# Patient Record
Sex: Male | Born: 1985 | Race: Black or African American | Hispanic: No | Marital: Single | Smoking: Never smoker
Health system: Southern US, Community
[De-identification: ages and names within clinical notes are randomized; demographics above are authoritative.]

## PROBLEM LIST (undated history)

## (undated) DIAGNOSIS — F329 Major depressive disorder, single episode, unspecified: Secondary | ICD-10-CM

## (undated) DIAGNOSIS — I10 Essential (primary) hypertension: Secondary | ICD-10-CM

## (undated) DIAGNOSIS — F419 Anxiety disorder, unspecified: Secondary | ICD-10-CM

## (undated) DIAGNOSIS — F32A Depression, unspecified: Secondary | ICD-10-CM

## (undated) DIAGNOSIS — J45909 Unspecified asthma, uncomplicated: Secondary | ICD-10-CM

## (undated) HISTORY — DX: Anxiety disorder, unspecified: F41.9

## (undated) HISTORY — PX: CARDIAC SURGERY: SHX584

## (undated) HISTORY — DX: Essential (primary) hypertension: I10

## (undated) HISTORY — DX: Major depressive disorder, single episode, unspecified: F32.9

## (undated) HISTORY — DX: Depression, unspecified: F32.A

## (undated) HISTORY — DX: Unspecified asthma, uncomplicated: J45.909

---
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
## 2012-03-25 ENCOUNTER — Emergency Department: Payer: Self-pay | Admitting: Emergency Medicine

## 2013-11-30 ENCOUNTER — Emergency Department: Payer: Self-pay | Admitting: Emergency Medicine

## 2013-11-30 LAB — CBC
HCT: 49.1 % (ref 40.0–52.0)
HGB: 16.8 g/dL (ref 13.0–18.0)
MCH: 32 pg (ref 26.0–34.0)
MCHC: 34.1 g/dL (ref 32.0–36.0)
MCV: 94 fL (ref 80–100)
PLATELETS: 223 10*3/uL (ref 150–440)
RBC: 5.23 10*6/uL (ref 4.40–5.90)
RDW: 12.7 % (ref 11.5–14.5)
WBC: 7.4 10*3/uL (ref 3.8–10.6)

## 2013-11-30 LAB — BASIC METABOLIC PANEL
Anion Gap: 6 — ABNORMAL LOW (ref 7–16)
BUN: 7 mg/dL (ref 7–18)
CO2: 29 mmol/L (ref 21–32)
Calcium, Total: 9.1 mg/dL (ref 8.5–10.1)
Chloride: 102 mmol/L (ref 98–107)
Creatinine: 1.18 mg/dL (ref 0.60–1.30)
EGFR (African American): >60
Glucose: 96 mg/dL (ref 65–99)
OSMOLALITY: 272 (ref 275–301)
Potassium: 3.8 mmol/L (ref 3.5–5.1)
Sodium: 137 mmol/L (ref 136–145)

## 2013-11-30 LAB — DRUG SCREEN, URINE
Amphetamines, Ur Screen: NEGATIVE (ref ?–1000)
BENZODIAZEPINE, UR SCRN: NEGATIVE (ref ?–200)
Barbiturates, Ur Screen: NEGATIVE (ref ?–200)
Cannabinoid 50 Ng, Ur ~~LOC~~: NEGATIVE (ref ?–50)
Cocaine Metabolite,Ur ~~LOC~~: NEGATIVE (ref ?–300)
MDMA (ECSTASY) UR SCREEN: NEGATIVE (ref ?–500)
Methadone, Ur Screen: NEGATIVE (ref ?–300)
OPIATE, UR SCREEN: NEGATIVE (ref ?–300)
Phencyclidine (PCP) Ur S: NEGATIVE (ref ?–25)
Tricyclic, Ur Screen: NEGATIVE (ref ?–1000)

## 2013-11-30 LAB — TROPONIN I: Troponin-I: <0.02 ng/mL

## 2013-11-30 LAB — CK TOTAL AND CKMB (NOT AT ARMC)
CK, TOTAL: 177 U/L
CK-MB: 0.9 ng/mL (ref 0.5–3.6)

## 2013-11-30 LAB — D-DIMER(ARMC)

## 2014-11-25 ENCOUNTER — Encounter: Payer: Self-pay | Admitting: Family Medicine

## 2015-01-02 IMAGING — CT CT ANGIO AORTIC DISSECTION W/ CM
2 of 6 series · 3 of 16 positions shown, 4 images · IV contrast (APPLIED)
Comparison: None.

CLINICAL DATA: Chest pain

EXAM:
CT ANGIOGRAPHY CHEST WITH CONTRAST
TECHNIQUE: Multidetector CT imaging of the chest was performed using the
standard protocol during bolus administration of intravenous
contrast. Multiplanar CT image reconstructions and MIPs were
obtained to evaluate the vascular anatomy.
CONTRAST:  100 cc Isovue 370.

[Series 6: arterial · axial · arterial · 0.61mm/px · z∈[-600,-488]mm · 2 of 169 slices shown, 3 images]
[im 57/169  soft-tissue]
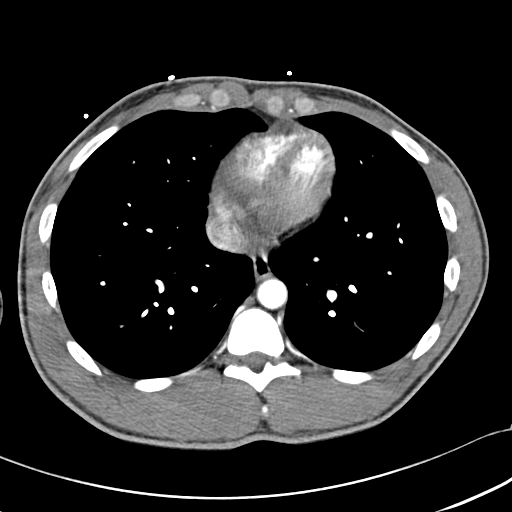
[im 57/169  bone]
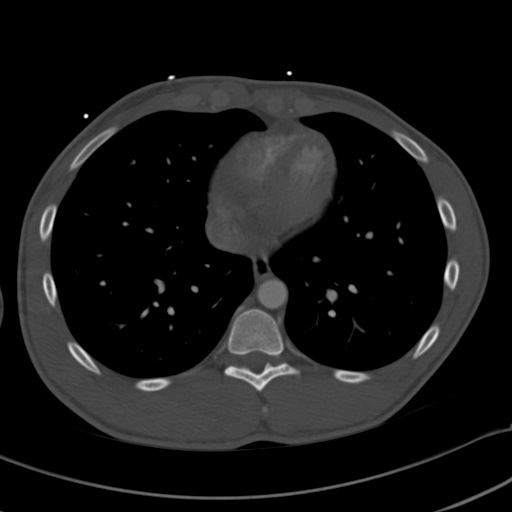
[im 113/169  soft-tissue]
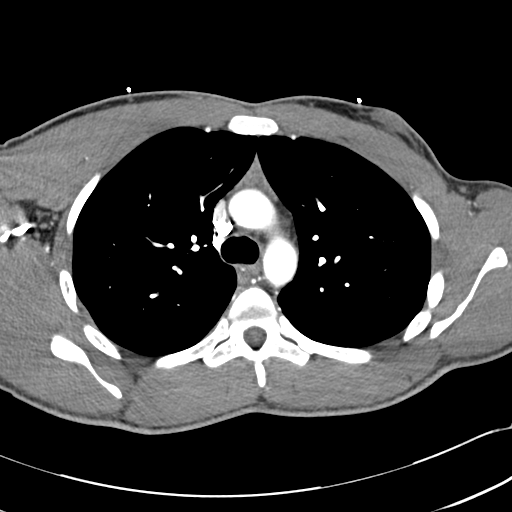

[Series 9: sag arterial mpr · sagittal · arterial · 0.52mm/px · 1 of 148 slices shown]
[im 74/148  soft-tissue]
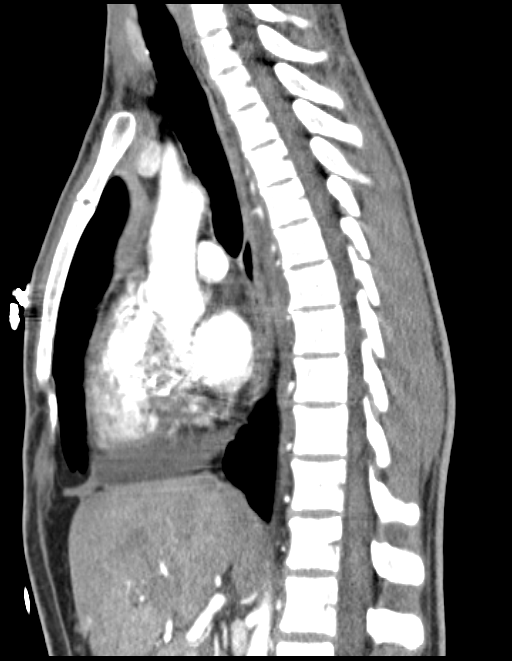

[3 of 16 positions shown; findings below may reference images not displayed]

FINDINGS: Pre contrast imaging shows no dense crescent in the wall of the
thoracic aorta suggest acute intramural hematoma. Imaging after IV
contrast administration shows no dissection of the thoracic aorta.
There is no thoracic aortic aneurysm. Pulmonary arteries are well
opacified and show no evidence for pulmonary embolus.

No evidence for axillary lymphadenopathy. No mediastinal or hilar
lymphadenopathy. Soft tissue attenuation in the anterior mediastinum
has characteristic triangular configuration as seen in cases of
thymic remnant. Heart size is normal. There is a small amount of
fluid inferior to the heart which appears to be pericardial in
location.

Bilateral gynecomastia is evident.

There is some minimal atelectasis in the dependent right lung base.
Lungs are otherwise clear.

Bone windows reveal no worrisome lytic or sclerotic osseous lesions.
Numerous small bilateral nonobstructing renal stones are evident.

Review of the MIP images confirms the above findings.
IMPRESSION: No thoracic aortic dissection.  No evidence for pulmonary embolus.

Small inferior pericardial effusion.

Bilateral gynecomastia.

Bilateral nephrolithiasis.

## 2017-02-25 ENCOUNTER — Encounter: Payer: Self-pay | Admitting: Family Medicine

## 2017-02-25 ENCOUNTER — Ambulatory Visit (INDEPENDENT_AMBULATORY_CARE_PROVIDER_SITE_OTHER): Payer: Self-pay | Admitting: Family Medicine

## 2017-02-25 VITALS — BP 110/68 | HR 93 | Temp 98.8°F | Resp 14 | Ht 67.0 in | Wt 138.4 lb

## 2017-02-25 DIAGNOSIS — Z9889 Other specified postprocedural states: Secondary | ICD-10-CM | POA: Insufficient documentation

## 2017-02-25 DIAGNOSIS — Z23 Encounter for immunization: Secondary | ICD-10-CM

## 2017-02-25 DIAGNOSIS — K029 Dental caries, unspecified: Secondary | ICD-10-CM

## 2017-02-25 MED ORDER — AMOXICILLIN 500 MG PO CAPS: 2000.0000 mg | ORAL_CAPSULE | Freq: Once | ORAL | 5 refills | Status: DC

## 2017-02-25 NOTE — Assessment & Plan Note (Addendum)
Antibiotic prophylaxis prior to dental cleaning, any procedures; refills provided

## 2017-02-25 NOTE — Patient Instructions (Signed)
You received the flu shot today; it should protect you against the flu virus over the coming months; it will take about two weeks for antibodies to develop; do try to stay away from hospitals, nursing homes, and daycares during peak flu season; taking extra vitamin C daily during flu season may help you avoid getting sick Please do take the antibiotic 30-60 minutes prior to your procedure(s)

## 2017-02-25 NOTE — Progress Notes (Signed)
BP 110/68   Pulse 93   Temp 98.8 F (37.1 C) (Oral)   Resp 14   Ht 5\' 7"  (1.702 m)   Wt 138 lb 6.4 oz (62.8 kg)   SpO2 98%   BMI 21.68 kg/m    Subjective:    Patient ID: Fernando Reid, male    DOB: 11-02-1985, 31 y.o.   MRN: [id number]  HPI: Fernando Reid is a 31 y.o. male  Chief Complaint  Patient presents with  . Medication Refill    antibiotic for teeth cleaning due to heart surgery as a child    HPI Patient is here to get antibiotics He has planned dental cleaning; he has a cavity on tooth # 8 near the gumline and goes to have cleaning and then cavity filled soon He had surgery as a child to repair a "hole" in his heart and needs antibiotics prior to dental procedures No fevers Health is overall good he says  Depression screen PHQ 2/9 02/25/2017  Decreased Interest 0  Down, Depressed, Hopeless 0  PHQ - 2 Score 0    Relevant past medical, surgical, family and social history reviewed Past Medical History:  Diagnosis Date  . Anxiety   . Asthma   . Depression   . Hypertension    Past Surgical History:  Procedure Laterality Date  . CARDIAC SURGERY     to fix hole in heart   Family History  Problem Relation Age of Onset  . Diabetes Mother   . Diabetes Father   . COPD Maternal Grandmother   . Bronchitis Maternal Grandmother   . Hypertension Maternal Grandfather   . CAD Maternal Grandfather   . Aneurysm Paternal Grandfather    Social History   Social History  . Marital status: Single    Spouse name: N/A  . Number of children: N/A  . Years of education: N/A   Occupational History  . Not on file.   Social History Main Topics  . Smoking status: Never Smoker  . Smokeless tobacco: Never Used  . Alcohol use No  . Drug use: No  . Sexual activity: Not Currently   Other Topics Concern  . Not on file   Social History Narrative  . No narrative on file    Interim medical history since last visit reviewed. Allergies and medications  reviewed  Review of Systems Per HPI unless specifically indicated above     Objective:    BP 110/68   Pulse 93   Temp 98.8 F (37.1 C) (Oral)   Resp 14   Ht 5\' 7"  (1.702 m)   Wt 138 lb 6.4 oz (62.8 kg)   SpO2 98%   BMI 21.68 kg/m   Wt Readings from Last 3 Encounters:  02/25/17 138 lb 6.4 oz (62.8 kg)    Physical Exam  Constitutional: He appears well-developed and well-nourished. No distress.  HENT:  Mouth/Throat: Dental caries present.  Dental caries noted, tooth #8 near the gum line; no erythema of gum, no drainage, no swelling  Eyes: No scleral icterus.  Cardiovascular: Normal rate and regular rhythm.   Pulmonary/Chest: Effort normal and breath sounds normal.  Neurological: He is alert.  Skin: No pallor.  Psychiatric: He has a normal mood and affect. His mood appears not anxious. He does not exhibit a depressed mood.      Assessment & Plan:   Problem List Items Addressed This Visit      Other   Status post cardiac surgery  Antibiotic prophylaxis prior to dental cleaning, any procedures; refills provided       Other Visit Diagnoses    Dental cavity    -  Primary   patient going to see dentist soon for cleaning/evaluation and then filling; antibiotic prophylaxis prior to BOTH, any procedures; Rx plus refills   Needs flu shot       offered and given today   Relevant Orders   Flu Vaccine QUAD 6+ mos PF IM (Fluarix Quad PF) (Completed)      Follow up plan: Return in about 3 months (around 05/28/2017) for complete physical.  An after-visit summary was printed and given to the patient at [facility].  Please see the patient instructions which may contain other information and recommendations beyond what is mentioned above in the assessment and plan.  Meds ordered this encounter  Medications  . amoxicillin (AMOXIL) 500 MG capsule    Sig: Take 4 capsules (2,000 mg total) by mouth once. 30-60 minutes prior to appointment    Dispense:  4 capsule    Refill:  5     Orders Placed This Encounter  Procedures  . Flu Vaccine QUAD 6+ mos PF IM (Fluarix Quad PF)

## 2017-05-30 ENCOUNTER — Encounter: Payer: Self-pay | Admitting: Family Medicine

## 2017-06-06 ENCOUNTER — Ambulatory Visit: Payer: Self-pay | Admitting: Family Medicine

## 2017-06-06 ENCOUNTER — Encounter: Payer: Self-pay | Admitting: *Deleted

## 2017-06-06 ENCOUNTER — Encounter: Payer: Self-pay | Admitting: Family Medicine

## 2017-06-06 VITALS — BP 184/96 | HR 87 | Temp 98.5°F | Resp 14 | Ht 65.25 in | Wt 137.7 lb

## 2017-06-06 DIAGNOSIS — Z114 Encounter for screening for human immunodeficiency virus [HIV]: Secondary | ICD-10-CM

## 2017-06-06 DIAGNOSIS — Z113 Encounter for screening for infections with a predominantly sexual mode of transmission: Secondary | ICD-10-CM

## 2017-06-06 DIAGNOSIS — Z0001 Encounter for general adult medical examination with abnormal findings: Secondary | ICD-10-CM

## 2017-06-06 DIAGNOSIS — Z23 Encounter for immunization: Secondary | ICD-10-CM

## 2017-06-06 DIAGNOSIS — Z0000 Encounter for general adult medical examination without abnormal findings: Secondary | ICD-10-CM | POA: Insufficient documentation

## 2017-06-06 DIAGNOSIS — D235 Other benign neoplasm of skin of trunk: Secondary | ICD-10-CM

## 2017-06-06 DIAGNOSIS — I10 Essential (primary) hypertension: Secondary | ICD-10-CM

## 2017-06-06 DIAGNOSIS — L608 Other nail disorders: Secondary | ICD-10-CM | POA: Insufficient documentation

## 2017-06-06 MED ORDER — TETANUS-DIPHTH-ACELL PERTUSSIS 5-2.5-18.5 LF-MCG/0.5 IM SUSP: 0.5000 mL | Freq: Once | INTRAMUSCULAR | Status: DC

## 2017-06-06 MED ORDER — HYDROCHLOROTHIAZIDE 25 MG PO TABS: 25.0000 mg | ORAL_TABLET | Freq: Every day | ORAL | 3 refills | Status: DC

## 2017-06-06 NOTE — Progress Notes (Signed)
Patient ID: Fernando Reid, male   DOB: 05-22-1985, 32 y.o.   MRN: [id number]   Subjective:   Fernando Reid is a 32 y.o. male here for a complete physical exam  Interim issues since last visit: a few colds, nothing serious  USPSTF grade A and B recommendations Depression:  Depression screen [place] 2/9 06/06/2017 02/25/2017  Decreased Interest 0 0  Down, Depressed, Hopeless 0 0  PHQ - 2 Score 0 0   Hypertension: shows up high on electric pump; has been on HCTZ in the past; in the 190s on top at the dentist, arm at the dentist and wrist at oral surgeon; that was also high; both electronic BP cuffs, but they checked both arms; did not have high blood pressure as a child; had chest pains about 5 years ago; crushing in his chest; went to the hospital; had high BP then; tried to bring that down; that's when he started the BP medicine; mother may have HTN and both maternal grandparents have HTN; he ran out of refills about 2 years ago; he denies fevers or night sweats; has had splinter hemorrhage for about 2 years  BP Readings from Last 3 Encounters:  06/06/17 (!) 184/96  02/25/17 110/68   Obesity: Wt Readings from Last 3 Encounters:  06/06/17 137 lb 11.2 oz (62.5 kg)  02/25/17 138 lb 6.4 oz (62.8 kg)   BMI Readings from Last 3 Encounters:  06/06/17 22.74 kg/m  02/25/17 21.68 kg/m     Skin cancer: he does have a mole on the back; been there for quite a while; not bleeding, pretty large Lung cancer:  nonsmoker Prostate cancer: at 32, no fam hx  No results found for: PSA Colorectal cancer: no fam hx  AAA: n/a Aspirin: na Diet: in between typical [place] eater except not much meat at all; good protein Exercise: trying to be active Alcohol: no Tobacco use: no HIV, hep B, hep C: check STD testing and prevention (chl/gon/syphilis): check  Lipids: runs in the family, grandfather No results found for: CHOL No results found for: HDL No results found for: LDLCALC No results  found for: TRIG No results found for: CHOLHDL No results found for: LDLDIRECT Glucose: both parents have diabetes; both developed in their 110s Glucose  Date Value Ref Range Status  11/30/2013 96 65 - 99 mg/dL Final    Past Medical History:  Diagnosis Date  . Anxiety   . Asthma   . Depression   . Hypertension    Past Surgical History:  Procedure Laterality Date  . CARDIAC SURGERY     to fix hole in heart   Family History  Problem Relation Age of Onset  . Diabetes Mother   . Diabetes Father   . COPD Maternal Grandmother   . Bronchitis Maternal Grandmother   . Hypertension Maternal Grandfather   . CAD Maternal Grandfather   . Aneurysm Paternal Grandfather    Social History   Tobacco Use  . Smoking status: Never Smoker  . Smokeless tobacco: Never Used  Substance Use Topics  . Alcohol use: No  . Drug use: No   Review of Systems  Objective:   Vitals:   06/06/17 1055 06/06/17 1131  BP: 124/72 (!) 184/96  Pulse: 87   Resp: 14   Temp: 98.5 F (36.9 C)   TempSrc: Oral   SpO2: 98%   Weight: 137 lb 11.2 oz (62.5 kg)   Height: 5' 5.25" (1.657 m)    Body mass index is  22.74 kg/m. Wt Readings from Last 3 Encounters:  06/06/17 137 lb 11.2 oz (62.5 kg)  02/25/17 138 lb 6.4 oz (62.8 kg)   Physical Exam  Constitutional: He appears well-developed and well-nourished. No distress.  HENT:  Head: Normocephalic and atraumatic.  Nose: Nose normal.  Mouth/Throat: Oropharynx is clear and moist.  Cerumen in both ears  Eyes: EOM are normal. No scleral icterus.  Neck: No JVD present. No thyromegaly present.  Cardiovascular: Normal rate, regular rhythm and normal heart sounds.  No extrasystoles are present.  No murmur heard. Pulses:      Radial pulses are 2+ on the right side, and 2+ on the left side.       Dorsalis pedis pulses are 1+ on the right side, and 1+ on the left side.  Pulmonary/Chest: Effort normal and breath sounds normal. No respiratory distress. He has no  wheezes. He has no rales.  Abdominal: Soft. Bowel sounds are normal. He exhibits no distension. There is no tenderness. There is no guarding.  Musculoskeletal: Normal range of motion. He exhibits no edema.  Lymphadenopathy:    He has no cervical adenopathy.  Neurological: He is alert. He displays normal reflexes. He exhibits normal muscle tone. Coordination normal.  Skin: Skin is warm and dry. No rash noted. He is not diaphoretic. No erythema. No pallor.  Splinter hemorrhages in four or five fingernails; none in the toenails; soft flesh-colored papular lesion on pedunculated stalk on the left side mid-back, perhaps 8-10 mm in greatest diameter  Psychiatric: He has a normal mood and affect. His behavior is normal. Judgment and thought content normal. His mood appears not anxious. He does not exhibit a depressed mood.   Assessment/Plan:   Problem List Items Addressed This Visit      Cardiovascular and Mediastinum   Essential hypertension, benign    Had staff go up to 200 mmHg with manual cuff and did in fact get high pressures; suspect gap between first real (phase I) and first perceived Korot[place]off sound previously; start back on HCTZ and return in 10 days for recheck and BMP; discussed DASH guidelines      Relevant Medications   hydrochlorothiazide (HYDRODIURIL) 25 MG tablet     Other   Splinter hemorrhage of fingernail    Refer to cardiologist; order echocardiogram; no fevers, no night sweat, good activity tolerance, no signs of CHF      Relevant Orders   ECHOCARDIOGRAM COMPLETE   Ambulatory referral to Cardiology   Preventative health care - Primary    USPSTF grade A and B recommendations reviewed with patient; age-appropriate recommendations, preventive care, screening tests, etc discussed and encouraged; healthy living encouraged; see AVS for patient education given to patient       Relevant Orders   CBC with Differential/Platelet   COMPLETE METABOLIC PANEL WITH GFR   Lipid  panel    Other Visit Diagnoses    Screening for HIV (human immunodeficiency virus)       Relevant Orders   HIV antibody (with reflex)   Need for Tdap vaccination       Relevant Orders   Tdap vaccine greater than or equal to 7yo IM (Completed)   Screening for STDs (sexually transmitted diseases)       Relevant Orders   HIV antibody (with reflex)   RPR   Hepatitis, Acute   C. trachomatis/N. gonorrhoeae RNA   Benign neoplasm of skin of back       Relevant Orders   Ambulatory referral to  General Surgery      Meds ordered this encounter  Medications  . DISCONTD: Tdap (BOOSTRIX) injection 0.5 mL  . hydrochlorothiazide (HYDRODIURIL) 25 MG tablet    Sig: Take 1 tablet (25 mg total) by mouth daily.    Dispense:  90 tablet    Refill:  3   Orders Placed This Encounter  Procedures  . C. trachomatis/N. gonorrhoeae RNA  . Tdap vaccine greater than or equal to 7yo IM  . HIV antibody (with reflex)  . RPR  . Hepatitis, Acute  . CBC with Differential/Platelet  . COMPLETE METABOLIC PANEL WITH GFR  . Lipid panel  . Ambulatory referral to General Surgery    Referral Priority:   Routine    Referral Type:   Surgical    Referral Reason:   Specialty Services Required    Requested Specialty:   General Surgery    Number of Visits Requested:   1  . Ambulatory referral to Cardiology    Referral Priority:   Routine    Referral Type:   Consultation    Referral Reason:   Specialty Services Required    Requested Specialty:   Cardiology    Number of Visits Requested:   1  . ECHOCARDIOGRAM COMPLETE    Standing Status:   Future    Standing Expiration Date:   09/05/2018    Scheduling Instructions:     Mornings please on a Monday or Friday preferred    Order Specific Question:   Where should this test be performed    Answer:   [facility]    Order Specific Question:   Perflutren DEFINITY (image enhancing agent) should be administered unless hypersensitivity or allergy exist    Answer:    Administer Perflutren    Order Specific Question:   Expected Date:    Answer:   ASAP    Follow up plan: Return in about 10 days (around 06/16/2017) for blood pressure recheck with CMA and BMP; 1 month with Dr. Sanda Klein.  An After Visit Summary was printed and given to the patient.

## 2017-06-06 NOTE — Assessment & Plan Note (Signed)
Refer to cardiologist; order echocardiogram; no fevers, no night sweat, good activity tolerance, no signs of CHF

## 2017-06-06 NOTE — Assessment & Plan Note (Signed)
Had staff go up to 200 mmHg with manual cuff and did in fact get high pressures; suspect gap between first real (phase I) and first perceived Korotkoff sound previously; start back on HCTZ and return in 10 days for recheck and BMP; discussed DASH guidelines

## 2017-06-06 NOTE — Patient Instructions (Addendum)
Start back on the HCTZ Return in 10 days or so for repeat check and one lab Try to follow the DASH guidelines (DASH stands for Dietary Approaches to Stop Hypertension). Try to limit the sodium in your diet to no more than 1,500mg  of sodium per day. Certainly try to not exceed 2,000 mg per day at the very most. Do not add salt when cooking or at the table.  Check the sodium amount on labels when shopping, and choose items lower in sodium when given a choice. Avoid or limit foods that already contain a lot of sodium. Eat a diet rich in fruits and vegetables and whole grains, and try to lose weight if overweight or obese We'll have you get the echocardiogram and see the cardiologist If you have not heard anything from my staff in a week about any orders/referrals/studies from today, please contact us here to follow-up [phone]  Health Maintenance, Male A healthy lifestyle and preventive care is important for your health and wellness. Ask your health care provider about what schedule of regular examinations is right for you. What should I know about weight and diet? Eat a Healthy Diet  Eat plenty of vegetables, fruits, whole grains, low-fat dairy products, and lean protein.  Do not eat a lot of foods high in solid fats, added sugars, or salt.  Maintain a Healthy Weight Regular exercise can help you achieve or maintain a healthy weight. You should:  Do at least 150 minutes of exercise each week. The exercise should increase your heart rate and make you sweat (moderate-intensity exercise).  Do strength-training exercises at least twice a week.  Watch Your Levels of Cholesterol and Blood Lipids  Have your blood tested for lipids and cholesterol every 5 years starting at 30 years of age. If you are at high risk for heart disease, you should start having your blood tested when you are 30 years old. You may need to have your cholesterol levels checked more often if: ? Your lipid or cholesterol  levels are high. ? You are older than 30 years of age. ? You are at high risk for heart disease.  What should I know about cancer screening? Many types of cancers can be detected early and may often be prevented. Lung Cancer  You should be screened every year for lung cancer if: ? You are a current smoker who has smoked for at least 30 years. ? You are a former smoker who has quit within the past 15 years.  Talk to your health care provider about your screening options, when you should start screening, and how often you should be screened.  Colorectal Cancer  Routine colorectal cancer screening usually begins at 30 years of age and should be repeated every 5-10 years until you are 30 years old. You may need to be screened more often if early forms of precancerous polyps or small growths are found. Your health care provider may recommend screening at an earlier age if you have risk factors for colon cancer.  Your health care provider may recommend using home test kits to check for hidden blood in the stool.  A small camera at the end of a tube can be used to examine your colon (sigmoidoscopy or colonoscopy). This checks for the earliest forms of colorectal cancer.  Prostate and Testicular Cancer  Depending on your age and overall health, your health care provider may do certain tests to screen for prostate and testicular cancer.  Talk to your health  care provider about any symptoms or concerns you have about testicular or prostate cancer.  Skin Cancer  Check your skin from head to toe regularly.  Tell your health care provider about any new moles or changes in moles, especially if: ? There is a change in a mole's size, shape, or color. ? You have a mole that is larger than a pencil eraser.  Always use sunscreen. Apply sunscreen liberally and repeat throughout the day.  Protect yourself by wearing long sleeves, pants, a wide-brimmed hat, and sunglasses when outside.  What should  I know about heart disease, diabetes, and high blood pressure?  If you are 91-5 years of age, have your blood pressure checked every 3-5 years. If you are 22 years of age or older, have your blood pressure checked every year. You should have your blood pressure measured twice-once when you are at a hospital or clinic, and once when you are not at a hospital or clinic. Record the average of the two measurements. To check your blood pressure when you are not at a hospital or clinic, you can use: ? An automated blood pressure machine at a pharmacy. ? A home blood pressure monitor.  Talk to your health care provider about your target blood pressure.  If you are between 59-77 years old, ask your health care provider if you should take aspirin to prevent heart disease.  Have regular diabetes screenings by checking your fasting blood sugar level. ? If you are at a normal weight and have a low risk for diabetes, have this test once every three years after the age of 60. ? If you are overweight and have a high risk for diabetes, consider being tested at a younger age or more often.  A one-time screening for abdominal aortic aneurysm (AAA) by ultrasound is recommended for men aged 75-75 years who are current or former smokers. What should I know about preventing infection? Hepatitis B If you have a higher risk for hepatitis B, you should be screened for this virus. Talk with your health care provider to find out if you are at risk for hepatitis B infection. Hepatitis C Blood testing is recommended for:  Everyone born from [birth year] through 1963.  Anyone with known risk factors for hepatitis C.  Sexually Transmitted Diseases (STDs)  You should be screened each year for STDs including gonorrhea and chlamydia if: ? You are sexually active and are younger than 30 years of age. ? You are older than 30 years of age and your health care provider tells you that you are at risk for this type of  infection. ? Your sexual activity has changed since you were last screened and you are at an increased risk for chlamydia or gonorrhea. Ask your health care provider if you are at risk.  Talk with your health care provider about whether you are at high risk of being infected with HIV. Your health care provider may recommend a prescription medicine to help prevent HIV infection.  What else can I do?  Schedule regular health, dental, and eye exams.  Stay current with your vaccines (immunizations).  Do not use any tobacco products, such as cigarettes, chewing tobacco, and e-cigarettes. If you need help quitting, ask your health care provider.  Limit alcohol intake to no more than 2 drinks per day. One drink equals 12 ounces of beer, 5 ounces of wine, or 1 ounces of hard liquor.  Do not use street drugs.  Do not share needles.  Ask your health care provider for help if you need support or information about quitting drugs.  Tell your health care provider if you often feel depressed.  Tell your health care provider if you have ever been abused or do not feel safe at home. This information is not intended to replace advice given to you by your health care provider. Make sure you discuss any questions you have with your health care provider. Document Released: 10/23/2007 Document Revised: 12/24/2015 Document Reviewed: 01/28/2015 Elsevier Interactive Patient Education  [employer].

## 2017-06-06 NOTE — Progress Notes (Signed)
rpr

## 2017-06-06 NOTE — Assessment & Plan Note (Signed)
USPSTF grade A and B recommendations reviewed with patient; age-appropriate recommendations, preventive care, screening tests, etc discussed and encouraged; healthy living encouraged; see AVS for patient education given to patient  

## 2017-06-07 ENCOUNTER — Other Ambulatory Visit: Payer: Self-pay | Admitting: Family Medicine

## 2017-06-07 DIAGNOSIS — D709 Neutropenia, unspecified: Secondary | ICD-10-CM

## 2017-06-07 LAB — CBC WITH DIFFERENTIAL/PLATELET
BASOS ABS: 30 {cells}/uL (ref 0–200)
Basophils Relative: 1.2 %
EOS PCT: 1.6 %
Eosinophils Absolute: 40 cells/uL (ref 15–500)
HEMATOCRIT: 45.5 % (ref 38.5–50.0)
Hemoglobin: 16.2 g/dL (ref 13.2–17.1)
LYMPHS ABS: 1483 {cells}/uL (ref 850–3900)
MCH: 32.3 pg (ref 27.0–33.0)
MCHC: 35.6 g/dL (ref 32.0–36.0)
MCV: 90.6 fL (ref 80.0–100.0)
MONOS PCT: 10.7 %
MPV: 10.5 fL (ref 7.5–12.5)
NEUTROS ABS: 680 {cells}/uL — AB (ref 1500–7800)
Neutrophils Relative %: 27.2 %
Platelets: 229 10*3/uL (ref 140–400)
RBC: 5.02 10*6/uL (ref 4.20–5.80)
RDW: 12.5 % (ref 11.0–15.0)
Total Lymphocyte: 59.3 %
WBC mixed population: 268 cells/uL (ref 200–950)
WBC: 2.5 10*3/uL — ABNORMAL LOW (ref 3.8–10.8)

## 2017-06-07 LAB — COMPLETE METABOLIC PANEL WITH GFR
AG Ratio: 1.5 (calc) (ref 1.0–2.5)
ALKALINE PHOSPHATASE (APISO): 46 U/L (ref 40–115)
ALT: 11 U/L (ref 9–46)
AST: 15 U/L (ref 10–40)
Albumin: 4.8 g/dL (ref 3.6–5.1)
BUN: 10 mg/dL (ref 7–25)
CO2: 30 mmol/L (ref 20–32)
CREATININE: 1.08 mg/dL (ref 0.60–1.35)
Calcium: 9.3 mg/dL (ref 8.6–10.3)
Chloride: 101 mmol/L (ref 98–110)
GFR, Est African American: 107 mL/min/{1.73_m2} (ref >=60)
GFR, Est Non African American: 92 mL/min/{1.73_m2} (ref >=60)
GLUCOSE: 97 mg/dL (ref 65–99)
Globulin: 3.1 g/dL (calc) (ref 1.9–3.7)
Potassium: 4.1 mmol/L (ref 3.5–5.3)
Sodium: 139 mmol/L (ref 135–146)
Total Bilirubin: 0.5 mg/dL (ref 0.2–1.2)
Total Protein: 7.9 g/dL (ref 6.1–8.1)

## 2017-06-07 LAB — LIPID PANEL
CHOL/HDL RATIO: 6 (calc) — AB (ref <5.0)
CHOLESTEROL: 169 mg/dL (ref <200)
HDL: 28 mg/dL — ABNORMAL LOW (ref >40)
LDL CHOLESTEROL (CALC): 109 mg/dL — AB
Non-HDL Cholesterol (Calc): 141 mg/dL (calc) — ABNORMAL HIGH (ref <130)
Triglycerides: 208 mg/dL — ABNORMAL HIGH (ref <150)

## 2017-06-07 LAB — RPR: RPR Ser Ql: NONREACTIVE

## 2017-06-07 LAB — C. TRACHOMATIS/N. GONORRHOEAE RNA
C. TRACHOMATIS RNA, TMA: NOT DETECTED
N. GONORRHOEAE RNA, TMA: NOT DETECTED

## 2017-06-07 LAB — HEPATITIS PANEL, ACUTE
HEP B C IGM: NONREACTIVE
HEP C AB: NONREACTIVE
Hep A IgM: NONREACTIVE
Hepatitis B Surface Ag: NONREACTIVE
SIGNAL TO CUT-OFF: 0.01 (ref <1.00)

## 2017-06-07 LAB — HIV ANTIBODY (ROUTINE TESTING W REFLEX): HIV 1&2 Ab, 4th Generation: NONREACTIVE

## 2017-06-07 NOTE — Progress Notes (Signed)
Patient has low WBC, recheck today or tomorrow

## 2017-06-08 ENCOUNTER — Encounter: Payer: Self-pay | Admitting: Family Medicine

## 2017-06-08 ENCOUNTER — Ambulatory Visit: Payer: Self-pay | Admitting: Family Medicine

## 2017-06-08 DIAGNOSIS — I10 Essential (primary) hypertension: Secondary | ICD-10-CM

## 2017-06-08 DIAGNOSIS — L608 Other nail disorders: Secondary | ICD-10-CM

## 2017-06-08 DIAGNOSIS — Z9889 Other specified postprocedural states: Secondary | ICD-10-CM

## 2017-06-08 DIAGNOSIS — D709 Neutropenia, unspecified: Secondary | ICD-10-CM

## 2017-06-08 DIAGNOSIS — N62 Hypertrophy of breast: Secondary | ICD-10-CM

## 2017-06-08 MED ORDER — METOPROLOL SUCCINATE ER 25 MG PO TB24: 25.0000 mg | ORAL_TABLET | Freq: Every day | ORAL | 0 refills | Status: DC

## 2017-06-08 NOTE — Progress Notes (Signed)
BP (!) 164/100 (BP Location: Left Arm, Cuff Size: Normal)   Pulse 96   Temp 98.3 F (36.8 C) (Oral)   Wt 137 lb (62.1 kg)   SpO2 99%   BMI 22.62 kg/m    Subjective:    Patient ID: Fernando Reid, male    DOB: October 12, 1985, 32 y.o.   MRN: [id number]  HPI: Fernando Reid is a 32 y.o. male  Chief Complaint  Patient presents with  . Follow-up    HPI He had two bad colds back to back He was having cold chills, little sweaty, coughing, sneezing, sinus issues (sinus congestion); probably fever and night sweats He hs appt with cardiologist on Feb 11th HTN; no problems with current med; trying to avoid salt He had chest pain in 2015, went to ER and had chest CT angio, reviewed together today -------------------------------------------- Chest angio CT 11/30/2013 IMPRESSION:  No thoracic aortic dissection.  No evidence for pulmonary embolus.   Small inferior pericardial effusion.   Bilateral gynecomastia.   Bilateral nephrolithiasis.    Electronically Signed    By: Misty Stanley M.D.    On: 11/30/2013 23:48  ---------------------------------------------- He had numerous kidney stones; bilateral gynecomastia, nothing new and no discharge; unchanged for years Grandfather is having some sort of issue with his kidneys; might have something to do with medicine  Depression screen [facility] 2/9 06/06/2017 02/25/2017  Decreased Interest 0 0  Down, Depressed, Hopeless 0 0  PHQ - 2 Score 0 0   Relevant past medical, surgical, family and social history reviewed Past Medical History:  Diagnosis Date  . Anxiety   . Asthma   . Depression   . Hypertension    Past Surgical History:  Procedure Laterality Date  . CARDIAC SURGERY     to fix hole in heart   Family History  Problem Relation Age of Onset  . Diabetes Mother   . Diabetes Father   . COPD Maternal Grandmother   . Bronchitis Maternal Grandmother   . Hypertension Maternal Grandfather   . CAD Maternal Grandfather    . Aneurysm Paternal Grandfather    Social History   Tobacco Use  . Smoking status: Never Smoker  . Smokeless tobacco: Never Used  Substance Use Topics  . Alcohol use: No  . Drug use: No    Interim medical history since last visit reviewed. Allergies and medications reviewed  Review of Systems Per HPI unless specifically indicated above     Objective:    BP (!) 164/100 (BP Location: Left Arm, Cuff Size: Normal)   Pulse 96   Temp 98.3 F (36.8 C) (Oral)   Wt 137 lb (62.1 kg)   SpO2 99%   BMI 22.62 kg/m   Wt Readings from Last 3 Encounters:  06/08/17 137 lb (62.1 kg)  06/06/17 137 lb 11.2 oz (62.5 kg)  02/25/17 138 lb 6.4 oz (62.8 kg)    Physical Exam  Constitutional: He appears well-developed and well-nourished. No distress.  Eyes: No scleral icterus.  Cardiovascular: Normal rate and regular rhythm.  Pulmonary/Chest: Effort normal and breath sounds normal.  Mild gynecomastia, symmetric, nontender, no masses  Neurological: He is alert.  Skin: No pallor.  Psychiatric: He has a normal mood and affect.   Results for orders placed or performed in visit on 06/08/17  CBC with Differential/Platelet  Result Value Ref Range   WBC 4.5 3.8 - 10.8 Thousand/uL   RBC 5.41 4.20 - 5.80 Million/uL   Hemoglobin 16.9 13.2 - 17.1  g/dL   HCT 48.6 38.5 - 50.0 %   MCV 89.8 80.0 - 100.0 fL   MCH 31.2 27.0 - 33.0 pg   MCHC 34.8 32.0 - 36.0 g/dL   RDW 12.7 11.0 - 15.0 %   Platelets 273 140 - 400 Thousand/uL   MPV 9.7 7.5 - 12.5 fL   Neutro Abs 1,562 1,500 - 7,800 cells/uL   Lymphs Abs 2,223 850 - 3,900 cells/uL   WBC mixed population 594 200 - 950 cells/uL   Eosinophils Absolute 90 15 - 500 cells/uL   Basophils Absolute 32 0 - 200 cells/uL   Neutrophils Relative % 34.7 %   Total Lymphocyte 49.4 %   Monocytes Relative 13.2 %   Eosinophils Relative 2.0 %   Basophils Relative 0.7 %  Pathologist smear review  Result Value Ref Range   Path Review    TSH  Result Value Ref Range     TSH 0.58 0.40 - 4.50 mIU/L  Microalbumin / creatinine urine ratio  Result Value Ref Range   Creatinine, Urine 196 20 - 320 mg/dL   Microalb, Ur 2.6 mg/dL   Microalb Creat Ratio 13 <30 mcg/mg creat      Assessment & Plan:   Problem List Items Addressed This Visit      Cardiovascular and Mediastinum   Accelerated hypertension    Check renal artery duplex; add 2nd agent; to see cardiologist soon      Relevant Medications   metoprolol succinate (TOPROL-XL) 25 MG 24 hr tablet   Other Relevant Orders   TSH (Completed)   Microalbumin / creatinine urine ratio (Completed)   US Renal Artery Stenosis     Other   Neutropenia (HCC)    Check CBC and peripheral smear      Relevant Orders   CBC with Differential/Platelet (Completed)   Pathologist smear review (Completed)   Status post cardiac surgery    Going to see cardiologist soon      Splinter hemorrhage of fingernail    Present for years; echo ordered, cardiology referral entered previously, appt pending; no fevers      Gynecomastia    Present for years per patient, unchanged, no nipple discharge, no palpable discrete masses, no pain; will follow          Follow up plan: No Follow-up on file.  An after-visit summary was printed and given to the patient at [facility].  Please see the patient instructions which may contain other information and recommendations beyond what is mentioned above in the assessment and plan.  Meds ordered this encounter  Medications  . metoprolol succinate (TOPROL-XL) 25 MG 24 hr tablet    Sig: Take 1 tablet (25 mg total) by mouth daily.    Dispense:  30 tablet    Refill:  0    Orders Placed This Encounter  Procedures  . US Renal Artery Stenosis  . CBC with Differential/Platelet  . Pathologist smear review  . TSH  . Microalbumin / creatinine urine ratio

## 2017-06-08 NOTE — Assessment & Plan Note (Addendum)
Check renal artery duplex; add 2nd agent; to see cardiologist soon

## 2017-06-08 NOTE — Patient Instructions (Addendum)
Stay hydrated Avoid tea and dark sodas Continue the HCTZ Add the metoprolol We'll get the echocardiogram and kidney ultrasound Return on Feb 7th for the visit for your blood pressure   DASH Eating Plan DASH stands for "Dietary Approaches to Stop Hypertension." The DASH eating plan is a healthy eating plan that has been shown to reduce high blood pressure (hypertension). It may also reduce your risk for type 2 diabetes, heart disease, and stroke. The DASH eating plan may also help with weight loss. What are tips for following this plan? General guidelines  Avoid eating more than 2,300 mg (milligrams) of salt (sodium) a day. If you have hypertension, you may need to reduce your sodium intake to 1,500 mg a day.  Limit alcohol intake to no more than 1 drink a day for nonpregnant women and 2 drinks a day for men. One drink equals 12 oz of beer, 5 oz of wine, or 1 oz of hard liquor.  Work with your health care provider to maintain a healthy body weight or to lose weight. Ask what an ideal weight is for you.  Get at least 30 minutes of exercise that causes your heart to beat faster (aerobic exercise) most days of the week. Activities may include walking, swimming, or biking.  Work with your health care provider or diet and nutrition specialist (dietitian) to adjust your eating plan to your individual calorie needs. Reading food labels  Check food labels for the amount of sodium per serving. Choose foods with less than 5 percent of the Daily Value of sodium. Generally, foods with less than 300 mg of sodium per serving fit into this eating plan.  To find whole grains, look for the word "whole" as the first word in the ingredient list. Shopping  Buy products labeled as "low-sodium" or "no salt added."  Buy fresh foods. Avoid canned foods and premade or frozen meals. Cooking  Avoid adding salt when cooking. Use salt-free seasonings or herbs instead of table salt or sea salt. Check with your  health care provider or pharmacist before using salt substitutes.  Do not fry foods. Cook foods using healthy methods such as baking, boiling, grilling, and broiling instead.  Cook with heart-healthy oils, such as olive, canola, soybean, or sunflower oil. Meal planning   Eat a balanced diet that includes: ? 5 or more servings of fruits and vegetables each day. At each meal, try to fill half of your plate with fruits and vegetables. ? Up to 6-8 servings of whole grains each day. ? Less than 6 oz of lean meat, poultry, or fish each day. A 3-oz serving of meat is about the same size as a deck of cards. One egg equals 1 oz. ? 2 servings of low-fat dairy each day. ? A serving of nuts, seeds, or beans 5 times each week. ? Heart-healthy fats. Healthy fats called Omega-3 fatty acids are found in foods such as flaxseeds and coldwater fish, like sardines, salmon, and mackerel.  Limit how much you eat of the following: ? Canned or prepackaged foods. ? Food that is high in trans fat, such as fried foods. ? Food that is high in saturated fat, such as fatty meat. ? Sweets, desserts, sugary drinks, and other foods with added sugar. ? Full-fat dairy products.  Do not salt foods before eating.  Try to eat at least 2 vegetarian meals each week.  Eat more home-cooked food and less restaurant, buffet, and fast food.  When eating at a restaurant,  ask that your food be prepared with less salt or no salt, if possible. What foods are recommended? The items listed may not be a complete list. Talk with your dietitian about what dietary choices are best for you. Grains Whole-grain or whole-wheat bread. Whole-grain or whole-wheat pasta. Brown rice. [place]. Bulgur. Whole-grain and low-sodium cereals. Pita bread. Low-fat, low-sodium crackers. Whole-wheat flour tortillas. Vegetables Fresh or frozen vegetables (raw, steamed, roasted, or grilled). Low-sodium or reduced-sodium tomato and vegetable juice.  Low-sodium or reduced-sodium tomato sauce and tomato paste. Low-sodium or reduced-sodium canned vegetables. Fruits All fresh, dried, or frozen fruit. Canned fruit in natural juice (without added sugar). Meat and other protein foods Skinless chicken or [place]. Ground chicken or [place]. Pork with fat trimmed off. Fish and seafood. Egg whites. Dried beans, peas, or lentils. Unsalted nuts, nut butters, and seeds. Unsalted canned beans. Lean cuts of beef with fat trimmed off. Low-sodium, lean deli meat. Dairy Low-fat (1%) or fat-free (skim) milk. Fat-free, low-fat, or reduced-fat cheeses. Nonfat, low-sodium ricotta or cottage cheese. Low-fat or nonfat yogurt. Low-fat, low-sodium cheese. Fats and oils Soft margarine without trans fats. Vegetable oil. Low-fat, reduced-fat, or light mayonnaise and salad dressings (reduced-sodium). Canola, safflower, olive, soybean, and sunflower oils. Avocado. Seasoning and other foods Herbs. Spices. Seasoning mixes without salt. Unsalted popcorn and pretzels. Fat-free sweets. What foods are not recommended? The items listed may not be a complete list. Talk with your dietitian about what dietary choices are best for you. Grains Baked goods made with fat, such as croissants, muffins, or some breads. Dry pasta or rice meal packs. Vegetables Creamed or fried vegetables. Vegetables in a cheese sauce. Regular canned vegetables (not low-sodium or reduced-sodium). Regular canned tomato sauce and paste (not low-sodium or reduced-sodium). Regular tomato and vegetable juice (not low-sodium or reduced-sodium). Angie Fava. Olives. Fruits Canned fruit in a light or heavy syrup. Fried fruit. Fruit in cream or butter sauce. Meat and other protein foods Fatty cuts of meat. Ribs. Fried meat. Berniece Salines. Sausage. Bologna and other processed lunch meats. Salami. Fatback. Hotdogs. Bratwurst. Salted nuts and seeds. Canned beans with added salt. Canned or smoked fish. Whole eggs or egg yolks. Chicken  or [place] with skin. Dairy Whole or 2% milk, cream, and half-and-half. Whole or full-fat cream cheese. Whole-fat or sweetened yogurt. Full-fat cheese. Nondairy creamers. Whipped toppings. Processed cheese and cheese spreads. Fats and oils Butter. Stick margarine. Lard. Shortening. Ghee. Bacon fat. Tropical oils, such as coconut, palm kernel, or palm oil. Seasoning and other foods Salted popcorn and pretzels. Onion salt, garlic salt, seasoned salt, table salt, and sea salt. Worcestershire sauce. Tartar sauce. Barbecue sauce. Teriyaki sauce. Soy sauce, including reduced-sodium. Steak sauce. Canned and packaged gravies. Fish sauce. Oyster sauce. Cocktail sauce. Horseradish that you find on the shelf. Ketchup. Mustard. Meat flavorings and tenderizers. Bouillon cubes. Hot sauce and Tabasco sauce. Premade or packaged marinades. Premade or packaged taco seasonings. Relishes. Regular salad dressings. Where to find more information:  National Heart, Lung, and [place]: https://wilson-eaton.com/  American Heart Association: www.heart.org Summary  The DASH eating plan is a healthy eating plan that has been shown to reduce high blood pressure (hypertension). It may also reduce your risk for type 2 diabetes, heart disease, and stroke.  With the DASH eating plan, you should limit salt (sodium) intake to 2,300 mg a day. If you have hypertension, you may need to reduce your sodium intake to 1,500 mg a day.  When on the DASH eating plan, aim to eat more fresh  fruits and vegetables, whole grains, lean proteins, low-fat dairy, and heart-healthy fats.  Work with your health care provider or diet and nutrition specialist (dietitian) to adjust your eating plan to your individual calorie needs. This information is not intended to replace advice given to you by your health care provider. Make sure you discuss any questions you have with your health care provider. Document Released: 04/15/2011 Document Revised:  04/19/2016 Document Reviewed: 04/19/2016 Elsevier Interactive Patient Education  [employer].

## 2017-06-08 NOTE — Assessment & Plan Note (Signed)
Check CBC and peripheral smear

## 2017-06-09 LAB — CBC WITH DIFFERENTIAL/PLATELET
BASOS PCT: 0.7 %
Basophils Absolute: 32 cells/uL (ref 0–200)
EOS ABS: 90 {cells}/uL (ref 15–500)
Eosinophils Relative: 2 %
HEMATOCRIT: 48.6 % (ref 38.5–50.0)
HEMOGLOBIN: 16.9 g/dL (ref 13.2–17.1)
LYMPHS ABS: 2223 {cells}/uL (ref 850–3900)
MCH: 31.2 pg (ref 27.0–33.0)
MCHC: 34.8 g/dL (ref 32.0–36.0)
MCV: 89.8 fL (ref 80.0–100.0)
MPV: 9.7 fL (ref 7.5–12.5)
Monocytes Relative: 13.2 %
NEUTROS ABS: 1562 {cells}/uL (ref 1500–7800)
Neutrophils Relative %: 34.7 %
PLATELETS: 273 10*3/uL (ref 140–400)
RBC: 5.41 10*6/uL (ref 4.20–5.80)
RDW: 12.7 % (ref 11.0–15.0)
Total Lymphocyte: 49.4 %
WBC: 4.5 10*3/uL (ref 3.8–10.8)
WBCMIX: 594 {cells}/uL (ref 200–950)

## 2017-06-09 LAB — TSH: TSH: 0.58 m[IU]/L (ref 0.40–4.50)

## 2017-06-09 LAB — MICROALBUMIN / CREATININE URINE RATIO
Creatinine, Urine: 196 mg/dL (ref 20–320)
MICROALB UR: 2.6 mg/dL
MICROALB/CREAT RATIO: 13 ug/mg{creat} (ref <30)

## 2017-06-09 LAB — PATHOLOGIST SMEAR REVIEW

## 2017-06-13 ENCOUNTER — Encounter: Payer: Self-pay | Admitting: *Deleted

## 2017-06-13 DIAGNOSIS — N62 Hypertrophy of breast: Secondary | ICD-10-CM | POA: Insufficient documentation

## 2017-06-13 NOTE — Assessment & Plan Note (Signed)
Present for years; echo ordered, cardiology referral entered previously, appt pending; no fevers

## 2017-06-13 NOTE — Assessment & Plan Note (Signed)
Going to see cardiologist soon

## 2017-06-13 NOTE — Assessment & Plan Note (Signed)
Present for years per patient, unchanged, no nipple discharge, no palpable discrete masses, no pain; will follow

## 2017-06-16 ENCOUNTER — Ambulatory Visit: Payer: Self-pay | Admitting: General Surgery

## 2017-06-16 ENCOUNTER — Ambulatory Visit: Payer: Self-pay

## 2017-06-16 VITALS — BP 142/86

## 2017-06-16 DIAGNOSIS — I10 Essential (primary) hypertension: Secondary | ICD-10-CM

## 2017-06-16 DIAGNOSIS — Z5181 Encounter for therapeutic drug level monitoring: Secondary | ICD-10-CM

## 2017-06-16 LAB — BASIC METABOLIC PANEL WITH GFR
BUN: 13 mg/dL (ref 7–25)
CALCIUM: 9.6 mg/dL (ref 8.6–10.3)
CHLORIDE: 103 mmol/L (ref 98–110)
CO2: 29 mmol/L (ref 20–32)
CREATININE: 1.14 mg/dL (ref 0.60–1.35)
GFR, Est African American: 100 mL/min/{1.73_m2} (ref >=60)
GFR, Est Non African American: 86 mL/min/{1.73_m2} (ref >=60)
GLUCOSE: 94 mg/dL (ref 65–99)
Potassium: 3.6 mmol/L (ref 3.5–5.3)
Sodium: 141 mmol/L (ref 135–146)

## 2017-06-19 NOTE — Progress Notes (Signed)
Cardiology Office Note  Date:  06/20/2017   ID:  Fernando Reid, DOB [birth year], MRN [id number]  PCP:  Arnetha Courser, MD   Chief Complaint  Patient presents with  . New Patient (Initial Visit)    Referred by Dr. Sanda Klein to help control blood pressure and for splinter hemorrhage of faingernails. Meds reviewed verbally with patient.     HPI:  Fernando Reid is a 32 year old gentleman with past medical history of Accelerated HTN Remote asthma, only in summer chest pain in 2015, went to ER and had chest CT angio, Cardiac surgery, "fix a hole in the heart", open heart surgery when age 55 (roughly) Presenting by referral by Dr. Sanda Klein for consultation of his accelerated HTN, splinter hemorrhages in his nails, prior history of "hole in the heart""  Recently seen by primary care, Dr.  Fuller Song on 2 blood pressure pills metoprolol and HCTZ  Echo ordered to be completed in the hospital, has not been completed Renal artery u/s ordered   reports often has elevated heart rate, it was felt metoprolol would help Denies having any significant leg edema, shortness of breath symptoms HCTZ started for hypertension  He does not have blood pressure cuff at home Wonders if it is genetic or what the reason for his hypertension is Does report several family members that have hypertension , grandparents, cousins   otherwise very active with no complaints  Asymptomatic splinter hemorrhages. Reports long-standing history. Never been a problem. Denies any TIA or stroke symptoms.  EKG personally reviewed by myself on todays visit Shows normal sinus rhythm rate 97 bpm no significant ST or T-wave changes  PMH:   has a past medical history of Anxiety, Asthma, Depression, and Hypertension.  PSH:    Past Surgical History:  Procedure Laterality Date  . CARDIAC SURGERY     to fix hole in heart    Current Outpatient Medications  Medication Sig Dispense Refill  . hydrochlorothiazide (HYDRODIURIL) 25 MG  tablet Take 1 tablet (25 mg total) by mouth daily. 90 tablet 3  . metoprolol succinate (TOPROL-XL) 25 MG 24 hr tablet Take 1 tablet (25 mg total) by mouth daily. 30 tablet 0   No current facility-administered medications for this visit.     Allergies:   Patient has no known allergies.   Social History:  The patient  reports that  has never smoked. he has never used smokeless tobacco. He reports that he does not drink alcohol or use drugs.   Family History:   family history includes Aneurysm in his paternal grandfather; Bronchitis in his maternal grandmother; CAD in his maternal grandfather; COPD in his maternal grandmother; Diabetes in his father and mother; Hypertension in his maternal grandfather.    Review of Systems: Review of Systems  Constitutional: Negative.   Respiratory: Negative.   Cardiovascular: Negative.   Gastrointestinal: Negative.   Musculoskeletal: Negative.   Neurological: Negative.   Psychiatric/Behavioral: Negative.   All other systems reviewed and are negative.    PHYSICAL EXAM: VS:  BP (!) 148/86 (BP Location: Right Arm, Patient Position: Sitting, Cuff Size: Normal)   Pulse 97   Ht 5\' 5"  (1.651 m)   Wt 150 lb (68 kg)   BMI 24.96 kg/m  , BMI Body mass index is 24.96 kg/m. GEN: Well nourished, well developed, in no acute distress  HEENT: normal  Neck: no JVD, carotid bruits, or masses Cardiac: RRR; no murmurs, rubs, or gallops,no edema  Respiratory:  clear to auscultation bilaterally, normal work  of breathing GI: soft, nontender, nondistended, + BS MS: no deformity or atrophy  Skin: warm and dry, no rash, 2-3 fingers on each hand with splinter hemorrhages , nontender  Neuro:  Strength and sensation are intact Psych: euthymic mood, full affect   Recent Labs: 06/06/2017: ALT 11 06/08/2017: Hemoglobin 16.9; Platelets 273; TSH 0.58 06/16/2017: BUN 13; Creat 1.14; Potassium 3.6; Sodium 141    Lipid Panel Lab Results  Component Value Date   CHOL 169  06/06/2017   HDL 28 (L) 06/06/2017   TRIG 208 (H) 06/06/2017      Wt Readings from Last 3 Encounters:  06/20/17 150 lb (68 kg)  06/08/17 137 lb (62.1 kg)  06/06/17 137 lb 11.2 oz (62.5 kg)       ASSESSMENT AND PLAN:  History of chest pain - Plan: EKG 12-Lead, ECHOCARDIOGRAM COMPLETE Denies having any recent episodes of chest pain, active at baseline with no symptoms   Murmur, cardiac - Plan: ECHOCARDIOGRAM COMPLETE He does have 1/6 inspiratory systolic murmur left sternal border  Given history of  "hole in the heart "echocardiogram has been ordered  We'll change the order to outpatient as he has no insurance . Should be lower cost for him   Splinter hemorrhage of fingernail  etiology unclear, differential is broad  Denies any history of vasculitis,  Echocardiogram pending , unable to exclude microemboli  Denies any trauma to his nails but does type on a keyboard for prolonged periods of time  He is asymptomatic If he is concerned, Suggested he could try low-dose aspirin 81 mg daily    Status post cardiac surgery  echocardiogram pending  unable to exclude ASD or VSD patch when he was a toddler   Accelerated hypertension  long discussion concerning his hypertension   strongly recommended he buy a blood pressure cuff   certainly possible he is having white coat hypertension Repeat blood pressure by myself 145/80 Slowly trending downward throughout visit Heart rate still mildly elevated Recommended he monitor blood pressure and heart rate at home if able to by blood pressure cuff If heart rate continues to run high he may be able to tolerate metoprolol 50 daily If blood pressure not very high at home potentially could hold the HCTZ Other options for blood pressure pills and African-American male include amlodipine/nifedipine, Ace/Arb No changes made on today's visit, only discussion of other options available  We will call him with the results of his echocardiogram   Disposition:   F/U  as needed    Total encounter time more than 60 minutes  Greater than 50% was spent in counseling and coordination of care with the patient Patient seen in consultation for Dr.  Sanda Klein and will be referred back to her office for ongoing care of the issues detailed above    Orders Placed This Encounter  Procedures  . EKG 12-Lead  . ECHOCARDIOGRAM COMPLETE     Signed, Esmond Plants, M.D., Ph.D. 06/20/2017  [place], [place]

## 2017-06-20 ENCOUNTER — Encounter: Payer: Self-pay | Admitting: Cardiovascular Disease

## 2017-06-20 ENCOUNTER — Ambulatory Visit: Payer: Self-pay | Admitting: Cardiovascular Disease

## 2017-06-20 VITALS — BP 148/86 | HR 97 | Ht 65.0 in | Wt 150.0 lb

## 2017-06-20 DIAGNOSIS — I10 Essential (primary) hypertension: Secondary | ICD-10-CM

## 2017-06-20 DIAGNOSIS — Z9889 Other specified postprocedural states: Secondary | ICD-10-CM

## 2017-06-20 DIAGNOSIS — Z87898 Personal history of other specified conditions: Secondary | ICD-10-CM

## 2017-06-20 DIAGNOSIS — R011 Cardiac murmur, unspecified: Secondary | ICD-10-CM

## 2017-06-20 DIAGNOSIS — L608 Other nail disorders: Secondary | ICD-10-CM

## 2017-06-20 NOTE — Patient Instructions (Signed)
Medication Instructions:   Check pressure and pulse at home  Ask Dr. Sanda Klein if you can take one  Perhaps metoprolol 50 mg once a day And hold HCTZ  Other options for blood perssure pill: Amlodipine/nifedipine: calcium channel blockers And /or ACE inhibitors or ARB  Consider aspirin for splinter hemorrhages  Labwork:  No new labs needed  Testing/Procedures:  We will order an echocardiogram for murmur, "hole in the heart", previous heart surgery (ASD? VSD?)  Follow-Up: It was a pleasure seeing you in the office today. Please call us if you have new issues that need to be addressed before your next appt.  [phone]  Your physician wants you to follow-up in As needed  If you need a refill on your cardiac medications before your next appointment, please call your pharmacy.

## 2017-06-29 ENCOUNTER — Encounter: Payer: Self-pay | Admitting: *Deleted

## 2017-07-07 ENCOUNTER — Ambulatory Visit: Payer: Self-pay | Admitting: Family Medicine

## 2017-07-07 ENCOUNTER — Encounter: Payer: Self-pay | Admitting: Family Medicine

## 2017-07-07 DIAGNOSIS — Z8279 Family history of other congenital malformations, deformations and chromosomal abnormalities: Secondary | ICD-10-CM | POA: Insufficient documentation

## 2017-07-07 DIAGNOSIS — Z9889 Other specified postprocedural states: Secondary | ICD-10-CM

## 2017-07-07 DIAGNOSIS — L608 Other nail disorders: Secondary | ICD-10-CM

## 2017-07-07 DIAGNOSIS — I10 Essential (primary) hypertension: Secondary | ICD-10-CM

## 2017-07-07 DIAGNOSIS — R000 Tachycardia, unspecified: Secondary | ICD-10-CM | POA: Insufficient documentation

## 2017-07-07 NOTE — Patient Instructions (Addendum)
Try to use PLAIN allergy medicine without the decongestant Avoid: phenylephrine, phenylpropanolamine, and pseudoephredine Try to follow the DASH guidelines (DASH stands for Dietary Approaches to Stop Hypertension). Try to limit the sodium in your diet to no more than 1,500mg  of sodium per day. Certainly try to not exceed 2,000 mg per day at the very most. Do not add salt when cooking or at the table.  Check the sodium amount on labels when shopping, and choose items lower in sodium when given a choice. Avoid or limit foods that already contain a lot of sodium. Eat a diet rich in fruits and vegetables and whole grains, and try to lose weight if overweight or obese Please do purchase a blood pressure cuff and notify me if top number is not less than 130 consistently Please do let the dentist know about that swelling

## 2017-07-07 NOTE — Assessment & Plan Note (Signed)
Improving with aspirin; seen by cardiologist; echo is pending

## 2017-07-07 NOTE — Progress Notes (Signed)
BP 138/84   Pulse 90   Temp 98.4 F (36.9 C) (Oral)   Resp 16   Wt 138 lb 3.2 oz (62.7 kg)   SpO2 97%   BMI 23.00 kg/m    Subjective:    Patient ID: Fernando Reid, male    DOB: 10-11-1985, 32 y.o.   MRN: [id number]  HPI: Fernando Reid is a 32 y.o. male  Chief Complaint  Patient presents with  . Follow-up    BP   HPI Patient is here for f/u of accelerated hypertension He also had splinter hemorrhages in 2-3 fingernails on each hand and was referred to cardiologist Dr. Rockey Situ saw him on 06/20/17 Echocardiogram ordered and not done yet, scheduled March 14th Last BMP normal BP at cardiologist was 148; he made note of changes to make if needed Had surgical repair of PDA He does eat some processed foods; not adding salt; not many salty snacks HTN runs in the family No decongestants  Swelling of the LEFT cheek while having cavities filled; dentist aware and said it should resolved; completely resolve after a week; felt like fluid in there  Depression screen [facility] 2/9 07/07/2017 06/06/2017 02/25/2017  Decreased Interest 0 0 0  Down, Depressed, Hopeless 0 0 0  PHQ - 2 Score 0 0 0   Relevant past medical, surgical, family and social history reviewed Past Medical History:  Diagnosis Date  . Anxiety   . Asthma   . Depression   . Hypertension    Past Surgical History:  Procedure Laterality Date  . CARDIAC SURGERY     to fix hole in heart   Family History  Problem Relation Age of Onset  . Diabetes Mother   . Diabetes Father   . COPD Maternal Grandmother   . Bronchitis Maternal Grandmother   . Hypertension Maternal Grandfather   . CAD Maternal Grandfather   . Aneurysm Paternal Grandfather    Social History   Tobacco Use  . Smoking status: Never Smoker  . Smokeless tobacco: Never Used  Substance Use Topics  . Alcohol use: No  . Drug use: No    Interim medical history since last visit reviewed. Allergies and medications reviewed  Review of  Systems Per HPI unless specifically indicated above     Objective:    BP 138/84   Pulse 90   Temp 98.4 F (36.9 C) (Oral)   Resp 16   Wt 138 lb 3.2 oz (62.7 kg)   SpO2 97%   BMI 23.00 kg/m   Wt Readings from Last 3 Encounters:  07/07/17 138 lb 3.2 oz (62.7 kg)  06/20/17 150 lb (68 kg)  06/08/17 137 lb (62.1 kg)   MD note: doubt veracity of the 150 pound weight recorded; our weights here on 1/30 and 2/28 are within 2 pounds of each other  Physical Exam  Constitutional: He appears well-developed and well-nourished. No distress.  Eyes: No scleral icterus.  Cardiovascular: Normal rate and regular rhythm.  Pulmonary/Chest: Effort normal and breath sounds normal.  Neurological: He is alert.  Skin: No pallor.  Splinter hemorrhages are growing out; no new hemorrhages in the nailbeds  Psychiatric: He has a normal mood and affect.    Results for orders placed or performed in visit on 19/37/90  BASIC METABOLIC PANEL WITH GFR  Result Value Ref Range   Glucose, Bld 94 65 - 99 mg/dL   BUN 13 7 - 25 mg/dL   Creat 1.14 0.60 - 1.35 mg/dL  GFR, Est Non African American 86 > OR = 60 mL/min/1.87m2   GFR, Est African American 100 > OR = 60 mL/min/1.63m2   BUN/Creatinine Ratio NOT APPLICABLE 6 - 22 (calc)   Sodium 141 135 - 146 mmol/L   Potassium 3.6 3.5 - 5.3 mmol/L   Chloride 103 98 - 110 mmol/L   CO2 29 20 - 32 mmol/L   Calcium 9.6 8.6 - 10.3 mg/dL      Assessment & Plan:   Problem List Items Addressed This Visit      Cardiovascular and Mediastinum   Essential hypertension, benign    Improving; patient wishes to buckle down on DASH guidelines; monitor BP and call me if not under 130/80; see cardiologist's note for BP med recommendations (reviewed); no abd bruit, not hyperthyroid        Other   Status post cardiac surgery   Splinter hemorrhage of fingernail    Improving with aspirin; seen by cardiologist; echo is pending          Follow up plan: Return in about 3  months (around 10/04/2017) for follow-up visit with Dr. Sanda Klein.  An after-visit summary was printed and given to the patient at [place].  Please see the patient instructions which may contain other information and recommendations beyond what is mentioned above in the assessment and plan.  No orders of the defined types were placed in this encounter.   No orders of the defined types were placed in this encounter.

## 2017-07-07 NOTE — Assessment & Plan Note (Signed)
Improving; patient wishes to buckle down on DASH guidelines; monitor BP and call me if not under 130/80; see cardiologist's note for BP med recommendations (reviewed); no abd bruit, not hyperthyroid

## 2017-07-13 ENCOUNTER — Other Ambulatory Visit: Payer: Self-pay | Admitting: Cardiovascular Disease

## 2017-07-13 ENCOUNTER — Other Ambulatory Visit: Payer: Self-pay | Admitting: Family Medicine

## 2017-07-13 DIAGNOSIS — R011 Cardiac murmur, unspecified: Secondary | ICD-10-CM

## 2017-07-13 DIAGNOSIS — I10 Essential (primary) hypertension: Secondary | ICD-10-CM

## 2017-07-13 DIAGNOSIS — Z8774 Personal history of (corrected) congenital malformations of heart and circulatory system: Secondary | ICD-10-CM

## 2017-07-21 ENCOUNTER — Ambulatory Visit (INDEPENDENT_AMBULATORY_CARE_PROVIDER_SITE_OTHER): Payer: Self-pay

## 2017-07-21 ENCOUNTER — Other Ambulatory Visit: Payer: Self-pay

## 2017-07-21 DIAGNOSIS — Z8774 Personal history of (corrected) congenital malformations of heart and circulatory system: Secondary | ICD-10-CM

## 2017-07-21 DIAGNOSIS — R011 Cardiac murmur, unspecified: Secondary | ICD-10-CM

## 2017-08-12 ENCOUNTER — Ambulatory Visit (INDEPENDENT_AMBULATORY_CARE_PROVIDER_SITE_OTHER): Payer: Self-pay

## 2017-08-12 DIAGNOSIS — I10 Essential (primary) hypertension: Secondary | ICD-10-CM

## 2017-08-15 ENCOUNTER — Other Ambulatory Visit: Payer: Self-pay | Admitting: Family Medicine

## 2017-08-15 DIAGNOSIS — I10 Essential (primary) hypertension: Secondary | ICD-10-CM

## 2017-08-15 DIAGNOSIS — N200 Calculus of kidney: Secondary | ICD-10-CM

## 2017-08-15 DIAGNOSIS — N289 Disorder of kidney and ureter, unspecified: Secondary | ICD-10-CM

## 2017-08-15 NOTE — Progress Notes (Signed)
Ordered renal US

## 2017-08-15 NOTE — Assessment & Plan Note (Signed)
Refer to nephrologist 

## 2017-08-15 NOTE — Progress Notes (Signed)
Refer to nephrologist 

## 2017-09-10 ENCOUNTER — Other Ambulatory Visit: Payer: Self-pay | Admitting: Family Medicine

## 2017-10-04 ENCOUNTER — Encounter: Payer: Self-pay | Admitting: Family Medicine

## 2017-10-04 ENCOUNTER — Ambulatory Visit (INDEPENDENT_AMBULATORY_CARE_PROVIDER_SITE_OTHER): Payer: Self-pay | Admitting: Family Medicine

## 2017-10-04 VITALS — BP 132/86 | HR 76 | Temp 98.6°F | Resp 12 | Ht 65.0 in | Wt 145.6 lb

## 2017-10-04 DIAGNOSIS — I10 Essential (primary) hypertension: Secondary | ICD-10-CM

## 2017-10-04 DIAGNOSIS — N289 Disorder of kidney and ureter, unspecified: Secondary | ICD-10-CM

## 2017-10-04 DIAGNOSIS — N200 Calculus of kidney: Secondary | ICD-10-CM

## 2017-10-04 DIAGNOSIS — R062 Wheezing: Secondary | ICD-10-CM

## 2017-10-04 MED ORDER — ALBUTEROL SULFATE HFA 108 (90 BASE) MCG/ACT IN AERS: 2.0000 | INHALATION_SPRAY | RESPIRATORY_TRACT | 1 refills | Status: AC | PRN

## 2017-10-04 MED ORDER — METOPROLOL SUCCINATE ER 25 MG PO TB24: 25.0000 mg | ORAL_TABLET | Freq: Every day | ORAL | 3 refills | Status: DC

## 2017-10-04 NOTE — Assessment & Plan Note (Signed)
Discussed findings from imaging study; stay hydrated, limit salt, dark colas and tea; see AVS

## 2017-10-04 NOTE — Progress Notes (Signed)
BP 132/86   Pulse 76   Temp 98.6 F (37 C) (Oral)   Resp 12   Ht 5\' 5"  (1.651 m)   Wt 145 lb 9.6 oz (66 kg)   SpO2 99%   BMI 24.23 kg/m    Subjective:    Patient ID: Fernando Reid, male    DOB: 08/01/1985, 32 y.o.   MRN: [id number]  HPI: Fernando Reid is a 32 y.o. male  Chief Complaint  Patient presents with  . Follow-up    HPI Patient is here for f/u He has high blood pressure He has seen cardiologist and was referred to nephrologist as well His work-up has included an echocardiogram, and a renal US (which has not been resulted yet, more than a week ago), and labs  He is not checking his BP away from here; worried about what he would see He had kidney stones on the scan; have never have bothered him; good water drinker; does drink sweet tea, stopped drinking soda 2.5 years ago; no blood in the urine No chest pain; no leg swelling Mother and aunt and grandfather all have HTN (maternal side) Avoiding salt  He does have asthma; bothering him the past two months; has been using his inhaler more often; just really during pollen season; none use for one week  Since last visit, corneal abrasion but no ulcer; healed up; right eye  Depression screen [facility] 2/9 10/04/2017 07/07/2017 06/06/2017 02/25/2017  Decreased Interest 0 0 0 0  Down, Depressed, Hopeless 0 0 0 0  PHQ - 2 Score 0 0 0 0    Relevant past medical, surgical, family and social history reviewed Past Medical History:  Diagnosis Date  . Anxiety   . Asthma   . Depression   . Hypertension    Past Surgical History:  Procedure Laterality Date  . CARDIAC SURGERY     to fix hole in heart   Family History  Problem Relation Age of Onset  . Diabetes Mother   . Diabetes Father   . COPD Maternal Grandmother   . Bronchitis Maternal Grandmother   . Hypertension Maternal Grandfather   . CAD Maternal Grandfather   . Aneurysm Paternal Grandfather    Social History   Tobacco Use  . Smoking status: Never  Smoker  . Smokeless tobacco: Never Used  Substance Use Topics  . Alcohol use: No  . Drug use: No    Interim medical history since last visit reviewed. Allergies and medications reviewed  Review of Systems Per HPI unless specifically indicated above     Objective:    BP 132/86   Pulse 76   Temp 98.6 F (37 C) (Oral)   Resp 12   Ht 5\' 5"  (1.651 m)   Wt 145 lb 9.6 oz (66 kg)   SpO2 99%   BMI 24.23 kg/m   Wt Readings from Last 3 Encounters:  10/04/17 145 lb 9.6 oz (66 kg)  07/07/17 138 lb 3.2 oz (62.7 kg)  06/20/17 150 lb (68 kg)    Physical Exam  Constitutional: He appears well-developed and well-nourished. No distress.  Eyes: No scleral icterus.  Cardiovascular: Normal rate and regular rhythm.  Pulmonary/Chest: Effort normal and breath sounds normal.  Neurological: He is alert.  Skin: No pallor.  Splinter hemorrhages growing out; none new in the capillary bed  Psychiatric: He has a normal mood and affect.      Assessment & Plan:   Problem List Items Addressed This Visit  Cardiovascular and Mediastinum   Essential hypertension, benign - Primary    Better control now; continue two agents, salt limitation; encouraged patient to keep appointment with nephrologist; arleady seen by cardiologist      Relevant Medications   metoprolol succinate (TOPROL-XL) 25 MG 24 hr tablet     Genitourinary   Renal disease    Noted on imaging; referred to nephrologist; patient did not go becaause he was spaciing out his appointments for financial reasons; he agrees to go now      Kidney stones    Discussed findings from imaging study; stay hydrated, limit salt, dark colas and tea; see AVS       Other Visit Diagnoses    Wheezing       intermittent; will consider adding ICS in the spring / fall; goal is SABA no more than 2x a week with good control       Follow up plan: Return in about 6 months (around 04/06/2018).  An after-visit summary was printed and given to the  patient at [place].  Please see the patient instructions which may contain other information and recommendations beyond what is mentioned above in the assessment and plan.  Meds ordered this encounter  Medications  . albuterol (PROVENTIL HFA;VENTOLIN HFA) 108 (90 Base) MCG/ACT inhaler    Sig: Inhale 2 puffs into the lungs every 4 (four) hours as needed for wheezing or shortness of breath.    Dispense:  1 Inhaler    Refill:  1  . metoprolol succinate (TOPROL-XL) 25 MG 24 hr tablet    Sig: Take 1 tablet (25 mg total) by mouth daily.    Dispense:  90 tablet    Refill:  3    Please consider 90 day supplies to promote better adherence    No orders of the defined types were placed in this encounter.

## 2017-10-04 NOTE — Patient Instructions (Addendum)
Please do see the nephrologist In the fall, let me know a few weeks ahead of expected asthma time and we'll start Flovent (daily preventive inhaler) Use the rescue inhaler as needed, and let me know if needing it more than 2x a week regularly Avoid kidney stone formation by staying well-hydrated Try to follow the DASH guidelines (DASH stands for Dietary Approaches to Stop Hypertension). Try to limit the sodium in your diet to no more than 1,500mg  of sodium per day. Certainly try to not exceed 2,000 mg per day at the very most. Do not add salt when cooking or at the table.  Check the sodium amount on labels when shopping, and choose items lower in sodium when given a choice. Avoid or limit foods that already contain a lot of sodium. Eat a diet rich in fruits and vegetables and whole grains, and try to lose weight if overweight or obese

## 2017-10-04 NOTE — Assessment & Plan Note (Signed)
Better control now; continue two agents, salt limitation; encouraged patient to keep appointment with nephrologist; arleady seen by cardiologist

## 2017-10-04 NOTE — Assessment & Plan Note (Signed)
Noted on imaging; referred to nephrologist; patient did not go becaause he was spaciing out his appointments for financial reasons; he agrees to go now

## 2018-03-30 ENCOUNTER — Other Ambulatory Visit: Payer: Self-pay | Admitting: Family Medicine

## 2018-04-10 ENCOUNTER — Ambulatory Visit (INDEPENDENT_AMBULATORY_CARE_PROVIDER_SITE_OTHER): Payer: Self-pay | Admitting: Family Medicine

## 2018-04-10 ENCOUNTER — Encounter: Payer: Self-pay | Admitting: Family Medicine

## 2018-04-10 VITALS — BP 128/88 | HR 91 | Temp 98.2°F | Ht 65.0 in | Wt 143.8 lb

## 2018-04-10 DIAGNOSIS — E786 Lipoprotein deficiency: Secondary | ICD-10-CM

## 2018-04-10 DIAGNOSIS — D709 Neutropenia, unspecified: Secondary | ICD-10-CM

## 2018-04-10 DIAGNOSIS — Z9889 Other specified postprocedural states: Secondary | ICD-10-CM

## 2018-04-10 DIAGNOSIS — Z23 Encounter for immunization: Secondary | ICD-10-CM

## 2018-04-10 DIAGNOSIS — Z5181 Encounter for therapeutic drug level monitoring: Secondary | ICD-10-CM

## 2018-04-10 DIAGNOSIS — N289 Disorder of kidney and ureter, unspecified: Secondary | ICD-10-CM

## 2018-04-10 DIAGNOSIS — I10 Essential (primary) hypertension: Secondary | ICD-10-CM

## 2018-04-10 NOTE — Progress Notes (Signed)
BP 128/88   Pulse 91   Temp 98.2 F (36.8 C) (Oral)   Ht 5\' 5"  (1.651 m)   Wt 143 lb 12.8 oz (65.2 kg)   SpO2 95%   BMI 23.93 kg/m    Subjective:    Patient ID: Fernando Reid, male    DOB: 1985/06/08, 32 y.o.   MRN: [id number]  HPI: Fernando Reid is a 32 y.o. male  Chief Complaint  Patient presents with  . Follow-up    HPI Patient is here for follow-up  Hypertension; controlled today with HCTZ and metoprolol He checked his BP a few weeks ago when he ran out of his medicine, there was a 3 day period when he didn't have any medicine; it was high, 180s off the medicine; back on and came down; no headaches or chest pain; trying to avoid salty foods  Flu vaccine today; already given  Low WBC in January and came back up; he been sick back to back with severe colds; last WBC was normal; had another cold 2 weeks ago  Low HDL; not sure if it runs in the family; grandfather and maybe an aunt take cholesterol medicine  Depression screen [facility] 2/9 10/04/2017 07/07/2017 06/06/2017 02/25/2017  Decreased Interest 0 0 0 0  Down, Depressed, Hopeless 0 0 0 0  PHQ - 2 Score 0 0 0 0   Fall Risk  04/10/2018 10/04/2017 07/07/2017 06/06/2017 02/25/2017  Falls in the past year? 0 No No No No  Number falls in past yr: 0 - - - -    Relevant past medical, surgical, family and social history reviewed Past Medical History:  Diagnosis Date  . Anxiety   . Asthma   . Depression   . Hypertension    Past Surgical History:  Procedure Laterality Date  . CARDIAC SURGERY     to fix hole in heart   Family History  Problem Relation Age of Onset  . Diabetes Mother   . Diabetes Father   . COPD Maternal Grandmother   . Bronchitis Maternal Grandmother   . Hypertension Maternal Grandfather   . CAD Maternal Grandfather   . Aneurysm Paternal Grandfather    Social History   Tobacco Use  . Smoking status: Never Smoker  . Smokeless tobacco: Never Used  Substance Use Topics  . Alcohol use:  No  . Drug use: No     Office Visit from 04/10/2018 in [facility]  AUDIT-C Score  0      Interim medical history since last visit reviewed. Allergies and medications reviewed  Review of Systems  Constitutional: Negative for unexpected weight change.  Cardiovascular: Negative for chest pain, palpitations and leg swelling.   Per HPI unless specifically indicated above     Objective:    BP 128/88   Pulse 91   Temp 98.2 F (36.8 C) (Oral)   Ht 5\' 5"  (1.651 m)   Wt 143 lb 12.8 oz (65.2 kg)   SpO2 95%   BMI 23.93 kg/m   Wt Readings from Last 3 Encounters:  04/10/18 143 lb 12.8 oz (65.2 kg)  10/04/17 145 lb 9.6 oz (66 kg)  07/07/17 138 lb 3.2 oz (62.7 kg)    Physical Exam  Constitutional: He appears well-developed and well-nourished. No distress.  HENT:  Head: Normocephalic and atraumatic.  Eyes: EOM are normal. No scleral icterus.  Neck: No thyromegaly present.  Cardiovascular: Normal rate and regular rhythm.  Pulmonary/Chest: Effort normal and breath sounds normal.  Abdominal: Bowel sounds are normal. He exhibits no distension.  Musculoskeletal: He exhibits no edema.  Neurological: Coordination normal.  Skin: Skin is warm and dry. No pallor.  Psychiatric: He has a normal mood and affect. His behavior is normal. Judgment and thought content normal.    Results for orders placed or performed in visit on 37/16/96  BASIC METABOLIC PANEL WITH GFR  Result Value Ref Range   Glucose, Bld 94 65 - 99 mg/dL   BUN 13 7 - 25 mg/dL   Creat 1.14 0.60 - 1.35 mg/dL   GFR, Est Non African American 86 > OR = 60 mL/min/1.92m2   GFR, Est African American 100 > OR = 60 mL/min/1.63m2   BUN/Creatinine Ratio NOT APPLICABLE 6 - 22 (calc)   Sodium 141 135 - 146 mmol/L   Potassium 3.6 3.5 - 5.3 mmol/L   Chloride 103 98 - 110 mmol/L   CO2 29 20 - 32 mmol/L   Calcium 9.6 8.6 - 10.3 mg/dL      Assessment & Plan:   Problem List Items Addressed This Visit       Cardiovascular and Mediastinum   Essential hypertension, benign - Primary    Try DASH guidelines; avoid decongestants; avoid NSAIDs; continue medicines        Genitourinary   Renal disease    Check renal US; check creatinine; avoid NSAIDs        Other   Status post cardiac surgery    Taking prophylactic antibiotics prior to dental procedures      Neutropenia (HCC)    resolved      Relevant Orders   CBC with Differential/Platelet   Low HDL (under 40)    Check labs today; likely inherited; not overweight, nonsmoker      Relevant Orders   Lipid panel    Other Visit Diagnoses    Medication monitoring encounter       Relevant Orders   COMPLETE METABOLIC PANEL WITH GFR   Need for influenza vaccination       Relevant Orders   Flu Vaccine QUAD 6+ mos PF IM (Fluarix Quad PF) (Completed)       Follow up plan: Return in about 6 months (around 10/10/2018).  An after-visit summary was printed and given to the patient at [place].  Please see the patient instructions which may contain other information and recommendations beyond what is mentioned above in the assessment and plan.  No orders of the defined types were placed in this encounter.   Orders Placed This Encounter  Procedures  . Flu Vaccine QUAD 6+ mos PF IM (Fluarix Quad PF)  . CBC with Differential/Platelet  . COMPLETE METABOLIC PANEL WITH GFR  . Lipid panel

## 2018-04-10 NOTE — Assessment & Plan Note (Signed)
resolved 

## 2018-04-10 NOTE — Assessment & Plan Note (Signed)
Check labs today; likely inherited; not overweight, nonsmoker

## 2018-04-10 NOTE — Assessment & Plan Note (Signed)
Taking prophylactic antibiotics prior to dental procedures

## 2018-04-10 NOTE — Assessment & Plan Note (Signed)
Try DASH guidelines; avoid decongestants; avoid NSAIDs; continue medicines

## 2018-04-10 NOTE — Patient Instructions (Addendum)
Try to use PLAIN allergy medicine without the decongestant Avoid: phenylephrine, phenylpropanolamine, and pseudoephredine  Try to follow the DASH guidelines (DASH stands for Dietary Approaches to Stop Hypertension). Try to limit the sodium in your diet to no more than 1,500mg  of sodium per day. Certainly try to not exceed 2,000 mg per day at the very most. Do not add salt when cooking or at the table.  Check the sodium amount on labels when shopping, and choose items lower in sodium when given a choice. Avoid or limit foods that already contain a lot of sodium. Eat a diet rich in fruits and vegetables and whole grains, and try to lose weight if overweight or obese  You received the flu shot today; it should protect you against the flu virus over the coming months; it will take about two weeks for antibodies to develop; do try to stay away from hospitals, nursing homes, and daycares during peak flu season; taking extra vitamin C daily during flu season may help you avoid getting sick  Please call [phone] to schedule your imaging test, ultrasound of the kidneys Please wait 2-3 days after the order has been placed to call and get your test scheduled  If you need something for aches or pains, try to use Tylenol (acetaminophen) instead of non-steroidals (which include Aleve, ibuprofen, Advil, Motrin, and naproxen); non-steroidals can cause long-term kidney damage   DASH Eating Plan DASH stands for "Dietary Approaches to Stop Hypertension." The DASH eating plan is a healthy eating plan that has been shown to reduce high blood pressure (hypertension). It may also reduce your risk for type 2 diabetes, heart disease, and stroke. The DASH eating plan may also help with weight loss. What are tips for following this plan? General guidelines  Avoid eating more than 2,300 mg (milligrams) of salt (sodium) a day. If you have hypertension, you may need to reduce your sodium intake to 1,500 mg a day.  Limit  alcohol intake to no more than 1 drink a day for nonpregnant women and 2 drinks a day for men. One drink equals 12 oz of beer, 5 oz of wine, or 1 oz of hard liquor.  Work with your health care provider to maintain a healthy body weight or to lose weight. Ask what an ideal weight is for you.  Get at least 30 minutes of exercise that causes your heart to beat faster (aerobic exercise) most days of the week. Activities may include walking, swimming, or biking.  Work with your health care provider or diet and nutrition specialist (dietitian) to adjust your eating plan to your individual calorie needs. Reading food labels  Check food labels for the amount of sodium per serving. Choose foods with less than 5 percent of the Daily Value of sodium. Generally, foods with less than 300 mg of sodium per serving fit into this eating plan.  To find whole grains, look for the word "whole" as the first word in the ingredient list. Shopping  Buy products labeled as "low-sodium" or "no salt added."  Buy fresh foods. Avoid canned foods and premade or frozen meals. Cooking  Avoid adding salt when cooking. Use salt-free seasonings or herbs instead of table salt or sea salt. Check with your health care provider or pharmacist before using salt substitutes.  Do not fry foods. Cook foods using healthy methods such as baking, boiling, grilling, and broiling instead.  Cook with heart-healthy oils, such as olive, canola, soybean, or sunflower oil. Meal planning  Eat a balanced diet that includes: ? 5 or more servings of fruits and vegetables each day. At each meal, try to fill half of your plate with fruits and vegetables. ? Up to 6-8 servings of whole grains each day. ? Less than 6 oz of lean meat, poultry, or fish each day. A 3-oz serving of meat is about the same size as a deck of cards. One egg equals 1 oz. ? 2 servings of low-fat dairy each day. ? A serving of nuts, seeds, or beans 5 times each  week. ? Heart-healthy fats. Healthy fats called Omega-3 fatty acids are found in foods such as flaxseeds and coldwater fish, like sardines, salmon, and mackerel.  Limit how much you eat of the following: ? Canned or prepackaged foods. ? Food that is high in trans fat, such as fried foods. ? Food that is high in saturated fat, such as fatty meat. ? Sweets, desserts, sugary drinks, and other foods with added sugar. ? Full-fat dairy products.  Do not salt foods before eating.  Try to eat at least 2 vegetarian meals each week.  Eat more home-cooked food and less restaurant, buffet, and fast food.  When eating at a restaurant, ask that your food be prepared with less salt or no salt, if possible. What foods are recommended? The items listed may not be a complete list. Talk with your dietitian about what dietary choices are best for you. Grains Whole-grain or whole-wheat bread. Whole-grain or whole-wheat pasta. Brown rice. [place]. Bulgur. Whole-grain and low-sodium cereals. Pita bread. Low-fat, low-sodium crackers. Whole-wheat flour tortillas. Vegetables Fresh or frozen vegetables (raw, steamed, roasted, or grilled). Low-sodium or reduced-sodium tomato and vegetable juice. Low-sodium or reduced-sodium tomato sauce and tomato paste. Low-sodium or reduced-sodium canned vegetables. Fruits All fresh, dried, or frozen fruit. Canned fruit in natural juice (without added sugar). Meat and other protein foods Skinless chicken or [place]. Ground chicken or [place]. Pork with fat trimmed off. Fish and seafood. Egg whites. Dried beans, peas, or lentils. Unsalted nuts, nut butters, and seeds. Unsalted canned beans. Lean cuts of beef with fat trimmed off. Low-sodium, lean deli meat. Dairy Low-fat (1%) or fat-free (skim) milk. Fat-free, low-fat, or reduced-fat cheeses. Nonfat, low-sodium ricotta or cottage cheese. Low-fat or nonfat yogurt. Low-fat, low-sodium cheese. Fats and oils Soft margarine  without trans fats. Vegetable oil. Low-fat, reduced-fat, or light mayonnaise and salad dressings (reduced-sodium). Canola, safflower, olive, soybean, and sunflower oils. Avocado. Seasoning and other foods Herbs. Spices. Seasoning mixes without salt. Unsalted popcorn and pretzels. Fat-free sweets. What foods are not recommended? The items listed may not be a complete list. Talk with your dietitian about what dietary choices are best for you. Grains Baked goods made with fat, such as croissants, muffins, or some breads. Dry pasta or rice meal packs. Vegetables Creamed or fried vegetables. Vegetables in a cheese sauce. Regular canned vegetables (not low-sodium or reduced-sodium). Regular canned tomato sauce and paste (not low-sodium or reduced-sodium). Regular tomato and vegetable juice (not low-sodium or reduced-sodium). Angie Fava. Olives. Fruits Canned fruit in a light or heavy syrup. Fried fruit. Fruit in cream or butter sauce. Meat and other protein foods Fatty cuts of meat. Ribs. Fried meat. Berniece Salines. Sausage. Bologna and other processed lunch meats. Salami. Fatback. Hotdogs. Bratwurst. Salted nuts and seeds. Canned beans with added salt. Canned or smoked fish. Whole eggs or egg yolks. Chicken or [place] with skin. Dairy Whole or 2% milk, cream, and half-and-half. Whole or full-fat cream cheese. Whole-fat or sweetened yogurt.  Full-fat cheese. Nondairy creamers. Whipped toppings. Processed cheese and cheese spreads. Fats and oils Butter. Stick margarine. Lard. Shortening. Ghee. Bacon fat. Tropical oils, such as coconut, palm kernel, or palm oil. Seasoning and other foods Salted popcorn and pretzels. Onion salt, garlic salt, seasoned salt, table salt, and sea salt. Worcestershire sauce. Tartar sauce. Barbecue sauce. Teriyaki sauce. Soy sauce, including reduced-sodium. Steak sauce. Canned and packaged gravies. Fish sauce. Oyster sauce. Cocktail sauce. Horseradish that you find on the shelf. Ketchup.  Mustard. Meat flavorings and tenderizers. Bouillon cubes. Hot sauce and Tabasco sauce. Premade or packaged marinades. Premade or packaged taco seasonings. Relishes. Regular salad dressings. Where to find more information:  National Heart, Lung, and [place]: https://wilson-eaton.com/  American Heart Association: www.heart.org Summary  The DASH eating plan is a healthy eating plan that has been shown to reduce high blood pressure (hypertension). It may also reduce your risk for type 2 diabetes, heart disease, and stroke.  With the DASH eating plan, you should limit salt (sodium) intake to 2,300 mg a day. If you have hypertension, you may need to reduce your sodium intake to 1,500 mg a day.  When on the DASH eating plan, aim to eat more fresh fruits and vegetables, whole grains, lean proteins, low-fat dairy, and heart-healthy fats.  Work with your health care provider or diet and nutrition specialist (dietitian) to adjust your eating plan to your individual calorie needs. This information is not intended to replace advice given to you by your health care provider. Make sure you discuss any questions you have with your health care provider. Document Released: 04/15/2011 Document Revised: 04/19/2016 Document Reviewed: 04/19/2016 Elsevier Interactive Patient Education  [employer].

## 2018-04-10 NOTE — Assessment & Plan Note (Signed)
Check renal US; check creatinine; avoid NSAIDs

## 2018-04-11 LAB — COMPLETE METABOLIC PANEL WITH GFR
AG Ratio: 1.9 (calc) (ref 1.0–2.5)
ALKALINE PHOSPHATASE (APISO): 45 U/L (ref 40–115)
ALT: 12 U/L (ref 9–46)
AST: 15 U/L (ref 10–40)
Albumin: 4.5 g/dL (ref 3.6–5.1)
BUN: 11 mg/dL (ref 7–25)
CO2: 28 mmol/L (ref 20–32)
CREATININE: 1.16 mg/dL (ref 0.60–1.35)
Calcium: 9.5 mg/dL (ref 8.6–10.3)
Chloride: 100 mmol/L (ref 98–110)
GFR, EST NON AFRICAN AMERICAN: 85 mL/min/{1.73_m2} (ref >=60)
GFR, Est African American: 98 mL/min/{1.73_m2} (ref >=60)
GLOBULIN: 2.4 g/dL (ref 1.9–3.7)
Glucose, Bld: 89 mg/dL (ref 65–99)
POTASSIUM: 3.7 mmol/L (ref 3.5–5.3)
SODIUM: 139 mmol/L (ref 135–146)
Total Bilirubin: 0.4 mg/dL (ref 0.2–1.2)
Total Protein: 6.9 g/dL (ref 6.1–8.1)

## 2018-04-11 LAB — LIPID PANEL
CHOL/HDL RATIO: 5.1 (calc) — AB (ref <5.0)
CHOLESTEROL: 184 mg/dL (ref <200)
HDL: 36 mg/dL — AB (ref >40)
LDL Cholesterol (Calc): 126 mg/dL (calc) — ABNORMAL HIGH
Non-HDL Cholesterol (Calc): 148 mg/dL (calc) — ABNORMAL HIGH (ref <130)
TRIGLYCERIDES: 110 mg/dL (ref <150)

## 2018-04-11 LAB — CBC WITH DIFFERENTIAL/PLATELET
BASOS ABS: 50 {cells}/uL (ref 0–200)
Basophils Relative: 1.4 %
EOS ABS: 101 {cells}/uL (ref 15–500)
EOS PCT: 2.8 %
HEMATOCRIT: 44.1 % (ref 38.5–50.0)
HEMOGLOBIN: 15.4 g/dL (ref 13.2–17.1)
LYMPHS ABS: 1786 {cells}/uL (ref 850–3900)
MCH: 31.6 pg (ref 27.0–33.0)
MCHC: 34.9 g/dL (ref 32.0–36.0)
MCV: 90.6 fL (ref 80.0–100.0)
MPV: 9.8 fL (ref 7.5–12.5)
Monocytes Relative: 10.1 %
NEUTROS ABS: 1300 {cells}/uL — AB (ref 1500–7800)
Neutrophils Relative %: 36.1 %
Platelets: 267 10*3/uL (ref 140–400)
RBC: 4.87 10*6/uL (ref 4.20–5.80)
RDW: 12.6 % (ref 11.0–15.0)
Total Lymphocyte: 49.6 %
WBC: 3.6 10*3/uL — ABNORMAL LOW (ref 3.8–10.8)
WBCMIX: 364 {cells}/uL (ref 200–950)

## 2018-04-12 ENCOUNTER — Other Ambulatory Visit: Payer: Self-pay | Admitting: Family Medicine

## 2018-04-12 DIAGNOSIS — E786 Lipoprotein deficiency: Secondary | ICD-10-CM

## 2018-04-12 DIAGNOSIS — D709 Neutropenia, unspecified: Secondary | ICD-10-CM

## 2018-04-12 NOTE — Progress Notes (Signed)
Recheck labs in 3-6 months

## 2018-07-05 ENCOUNTER — Other Ambulatory Visit: Payer: Self-pay | Admitting: Family Medicine

## 2018-07-06 NOTE — Telephone Encounter (Signed)
Lab Results  Component Value Date   CREATININE 1.16 04/10/2018   Lab Results  Component Value Date   K 3.7 04/10/2018

## 2018-10-10 ENCOUNTER — Ambulatory Visit: Payer: Self-pay | Admitting: Nurse Practitioner

## 2018-10-10 ENCOUNTER — Other Ambulatory Visit (HOSPITAL_COMMUNITY)
Admission: RE | Admit: 2018-10-10 | Discharge: 2018-10-10 | Disposition: A | Discharge status: Home / Self Care | Payer: Self-pay | Source: Ambulatory Visit | Attending: Family Medicine | Admitting: Family Medicine

## 2018-10-10 ENCOUNTER — Other Ambulatory Visit: Payer: Self-pay

## 2018-10-10 ENCOUNTER — Encounter: Payer: Self-pay | Admitting: Nurse Practitioner

## 2018-10-10 VITALS — BP 116/70 | HR 99 | Temp 98.5°F | Resp 14 | Ht 65.0 in | Wt 139.3 lb

## 2018-10-10 DIAGNOSIS — E782 Mixed hyperlipidemia: Secondary | ICD-10-CM

## 2018-10-10 DIAGNOSIS — D709 Neutropenia, unspecified: Secondary | ICD-10-CM

## 2018-10-10 DIAGNOSIS — Z113 Encounter for screening for infections with a predominantly sexual mode of transmission: Secondary | ICD-10-CM | POA: Insufficient documentation

## 2018-10-10 DIAGNOSIS — N289 Disorder of kidney and ureter, unspecified: Secondary | ICD-10-CM

## 2018-10-10 DIAGNOSIS — I10 Essential (primary) hypertension: Secondary | ICD-10-CM

## 2018-10-10 DIAGNOSIS — K582 Mixed irritable bowel syndrome: Secondary | ICD-10-CM

## 2018-10-10 MED ORDER — METOPROLOL SUCCINATE ER 25 MG PO TB24: 25.0000 mg | ORAL_TABLET | Freq: Every day | ORAL | 3 refills | Status: DC

## 2018-10-10 NOTE — Patient Instructions (Addendum)
Good cholesterol, also called high-density lipoprotein (HDL) removes extra cholesterol and plaque buildup in your arteries and then sends it to your liver to get rid of and helps reduce your risk of heart disease, heart attack, and stroke.Foods that increase HDL: beans and legumes, whole grains, high-fiber fruits:prunes, apples, and pears; fatty fish- salmon, tuna, sardines; nuts, olive oil  Bad cholesterol, also called low-density lipoprotein (LDL), carries cholesterol and other fats that your liver makes to your body tissue. If it builds up in blood vessels, LDL can cause heart disease and other health problems. Your LDL level should be below 100. If you have diabetes or a possible heart problem, your LDL should be below 70.  Eat: Eat 20 to 30 grams of soluble fiber every day. Foods such as fruits and vegetables, whole grains, beans, peas, nuts, and seeds can help lower LDL. Avoid: Saturated fats (Dairy foods - such as butter, cream, ghee, regular-fat milk and cheese. Meat - such as fatty cuts of beef, pork and lamb, processed meats like salami, sausages and the skin on chicken. Lard., fatty snack foods, cakes, biscuits, pies and deep fried foods) Avoid smoking   Diet for Irritable Bowel Syndrome When you have irritable bowel syndrome (IBS), it is very important to eat the foods and follow the eating habits that are best for your condition. IBS may cause various symptoms such as pain in the abdomen, constipation, or diarrhea. Choosing the right foods can help to ease the discomfort from these symptoms. Work with your health care provider and diet and nutrition specialist (dietitian) to find the eating plan that will help to control your symptoms. What are tips for following this plan?    Keep a food diary. This will help you identify foods that cause symptoms. Write down: ? What you eat and when you eat it. ? What symptoms you have. ? When symptoms occur in relation to your meals, such as "pain in  abdomen 2 hours after dinner."  Eat your meals slowly and in a relaxed setting.  Aim to eat 5-6 small meals per day. Do not skip meals.  Drink enough fluid to keep your urine pale yellow.  Ask your health care provider if you should take an over-the-counter probiotic to help restore healthy bacteria in your gut (digestive tract). ? Probiotics are foods that contain good bacteria and yeasts.  Your dietitian may have specific dietary recommendations for you based on your symptoms. He or she may recommend that you: ? Avoid foods that cause symptoms. Talk with your dietitian about other ways to get the same nutrients that are in those problem foods. ? Avoid foods with gluten. Gluten is a protein that is found in rye, wheat, and barley. ? Eat more foods that contain soluble fiber. Examples of foods with high soluble fiber include oats, seeds, and certain fruits and vegetables. Take a fiber supplement if directed by your dietitian. ? Reduce or avoid certain foods called FODMAPs. These are foods that contain carbohydrates that are hard to digest. Ask your doctor which foods contain these carbohydrates. What foods are not recommended? The following are some foods and drinks that may make your symptoms worse:  Fatty foods, such as french fries.  Foods that contain gluten, such as pasta and cereal.  Dairy products, such as milk, cheese, and ice cream.  Chocolate.  Alcohol.  Products with caffeine, such as coffee.  Carbonated drinks, such as soda.  Foods that are high in FODMAPs. These include certain fruits and  vegetables.  Products with sweeteners such as honey, high fructose corn syrup, sorbitol, and mannitol. The items listed above may not be a complete list of foods and beverages you should avoid. Contact a dietitian for more information. What foods are good sources of fiber? Your health care provider or dietitian may recommend that you eat more foods that contain fiber. Fiber can  help to reduce constipation and other IBS symptoms. Add foods with fiber to your diet a little at a time so your body can get used to them. Too much fiber at one time might cause gas and swelling of your abdomen. The following are some foods that are good sources of fiber:  Berries, such as raspberries, strawberries, and blueberries.  Tomatoes.  Carrots.  Brown rice.  Oats.  Seeds, such as chia and pumpkin seeds. The items listed above may not be a complete list of recommended sources of fiber. Contact your dietitian for more options. Where to find more information  International Foundation for Functional Gastrointestinal Disorders: www.iffgd.[employer] of Diabetes and Digestive and Kidney Diseases: [employer].dk Summary  When you have irritable bowel syndrome (IBS), it is very important to eat the foods and follow the eating habits that are best for your condition.  IBS may cause various symptoms such as pain in the abdomen, constipation, or diarrhea.  Choosing the right foods can help to ease the discomfort that comes from symptoms.  Keep a food diary. This will help you identify foods that cause symptoms.  Your health care provider or diet and nutrition specialist (dietitian) may recommend that you eat more foods that contain fiber. This information is not intended to replace advice given to you by your health care provider. Make sure you discuss any questions you have with your health care provider. Document Released: 07/17/2003 Document Revised: 11/21/2017 Document Reviewed: 12/28/2016 Elsevier Interactive Patient Education  2019 [employer].

## 2018-10-10 NOTE — Progress Notes (Signed)
Name: Fernando Reid   MRN: [id number]    DOB: 03-Feb-1986   Date:10/10/2018       Progress Note  Subjective  Chief Complaint  Chief Complaint  Patient presents with  . Follow-up    HPI  Hypertension Patient is on HCTZ 25mg  and metoprolol 25mg  dail.  Patient has strong family history of hypertension on both parents sides, however neither of his parents have it.  Takes medications as prescribed with few missed doses a month.  Is compliant with low-salt diet.  Occasionally checks blood pressures at home with range of 120/70- 150/80 Denies chest pain, headaches, blurry vision.  BP Readings from Last 3 Encounters:  10/10/18 116/70  04/10/18 128/88  10/04/17 132/86   Hyperlipidemia Eats a lot of vegetables, not a lot of meats, likes breads and pastas. Stopped drinking sodas last year. Water during the week only and some sweet tea on the weekends.  Lab Results  Component Value Date   CHOL 184 04/10/2018   HDL 36 (L) 04/10/2018   LDLCALC 126 (H) 04/10/2018   TRIG 110 04/10/2018   CHOLHDL 5.1 (H) 04/10/2018   Patient notes stools consistency is constantly changing in bristol scale from 1-7 over the past 3 months. States has not been aware of diet to see if its food related. Patient notes some abdominal cramping and pain that is relieved by defecation, pain has started with stool consistency changes. Denies stool color changes. Denies increased anxiety or stress.   PHQ2/9: Depression screen [facility] 2/9 10/10/2018 10/04/2017 07/07/2017 06/06/2017 02/25/2017  Decreased Interest 0 0 0 0 0  Down, Depressed, Hopeless 0 0 0 0 0  PHQ - 2 Score 0 0 0 0 0  Altered sleeping 0 - - - -  Tired, decreased energy 0 - - - -  Change in appetite 0 - - - -  Feeling bad or failure about yourself  0 - - - -  Trouble concentrating 0 - - - -  Moving slowly or fidgety/restless 0 - - - -  Suicidal thoughts 0 - - - -  PHQ-9 Score 0 - - - -  Difficult doing work/chores Not difficult at all - - - -      PHQ reviewed. Negative  Patient Active Problem List   Diagnosis Date Noted  . Low HDL (under 40) 04/10/2018  . Renal disease 08/15/2017  . Kidney stones 08/15/2017  . Family history of congenital anomaly of cardiovascular system 07/07/2017  . Gynecomastia 06/13/2017  . Accelerated hypertension 06/08/2017  . Neutropenia ([place]) 06/08/2017  . Preventative health care 06/06/2017  . Splinter hemorrhage of fingernail 06/06/2017  . Essential hypertension, benign 06/06/2017  . Status post cardiac surgery 02/25/2017    Past Medical History:  Diagnosis Date  . Anxiety   . Asthma   . Depression   . Hypertension     Past Surgical History:  Procedure Laterality Date  . CARDIAC SURGERY     to fix hole in heart    Social History   Tobacco Use  . Smoking status: Never Smoker  . Smokeless tobacco: Never Used  Substance Use Topics  . Alcohol use: No     Current Outpatient Medications:  .  albuterol (PROVENTIL HFA;VENTOLIN HFA) 108 (90 Base) MCG/ACT inhaler, Inhale 2 puffs into the lungs every 4 (four) hours as needed for wheezing or shortness of breath., Disp: 1 Inhaler, Rfl: 1 .  amoxicillin (AMOXIL) 500 MG capsule, TAKE 4 CAPSULES BY MOUTH ONCE 30 TO 60 MINUTES  PRIOR TO APPOINTMENT, Disp: 4 capsule, Rfl: 5 .  hydrochlorothiazide (HYDRODIURIL) 25 MG tablet, Take 1 tablet by mouth once daily, Disp: 90 tablet, Rfl: 3 .  metoprolol succinate (TOPROL-XL) 25 MG 24 hr tablet, Take 1 tablet (25 mg total) by mouth daily., Disp: 90 tablet, Rfl: 3  No Known Allergies  Review of Systems  Constitutional: Negative for chills, fever and malaise/fatigue.  HENT: Negative for congestion, sinus pain and sore throat.   Eyes: Negative for blurred vision.  Respiratory: Negative for cough and shortness of breath.   Cardiovascular: Negative for chest pain, palpitations and leg swelling.  Gastrointestinal: Negative for abdominal pain, constipation, diarrhea and nausea.  Genitourinary: Negative  for dysuria, frequency and hematuria.  Musculoskeletal: Negative for back pain and joint pain.  Skin: Negative for rash.  Neurological: Negative for dizziness and headaches. Weakness:   Endo/Heme/Allergies: Negative for polydipsia.  Psychiatric/Behavioral: The patient is not nervous/anxious and does not have insomnia.       No other specific complaints in a complete review of systems (except as listed in HPI above).  Objective  Vitals:   10/10/18 0944  BP: 116/70  Pulse: 99  Resp: 14  Temp: 98.5 F (36.9 C)  TempSrc: Oral  SpO2: 99%  Weight: 139 lb 4.8 oz (63.2 kg)  Height: 5\' 5"  (1.651 m)     Body mass index is 23.18 kg/m.  Nursing Note and Vital Signs reviewed.  Physical Exam Vitals signs reviewed.  Constitutional:      Appearance: He is well-developed.  HENT:     Head: Normocephalic and atraumatic.  Neck:     Musculoskeletal: Normal range of motion and neck supple.     Vascular: No carotid bruit.  Cardiovascular:     Heart sounds: Normal heart sounds.  Pulmonary:     Effort: Pulmonary effort is normal.     Breath sounds: Normal breath sounds.  Abdominal:     General: Bowel sounds are normal.     Palpations: Abdomen is soft.     Tenderness: There is no abdominal tenderness. There is no right CVA tenderness or left CVA tenderness.  Musculoskeletal: Normal range of motion.  Skin:    General: Skin is warm and dry.     Capillary Refill: Capillary refill takes less than 2 seconds.  Neurological:     Mental Status: He is alert and oriented to person, place, and time.     GCS: GCS eye subscore is 4. GCS verbal subscore is 5. GCS motor subscore is 6.     Sensory: No sensory deficit.  Psychiatric:        Speech: Speech normal.        Behavior: Behavior normal.        Thought Content: Thought content normal.        Judgment: Judgment normal.      No results found for this or any previous visit (from the past 48 hour(s)).  Assessment & Plan  1. Essential  hypertension, benign Stable continue meds  - Basic Metabolic Panel (BMET) - metoprolol succinate (TOPROL-XL) 25 MG 24 hr tablet; Take 1 tablet (25 mg total) by mouth daily.  Dispense: 90 tablet; Refill: 3  2. Neutropenia, unspecified type ([place]) monitor - CBC w/Diff/Platelet  3. Mixed hyperlipidema Discussed diet  - Lipid Profile  4. Renal disease Monitor GFR - Basic Metabolic Panel (BMET)  5. Irritable bowel syndrome with both constipation and diarrhea Discussed food diary and low FODMAP diet   6. Screening for STDs (  sexually transmitted diseases)  - HIV antibody (with reflex) - RPR - Hepatitis panel, acute - Cervicovaginal ancillary only - Urine cytology ancillary only

## 2018-10-11 LAB — BASIC METABOLIC PANEL
BUN: 8 mg/dL (ref 7–25)
CO2: 26 mmol/L (ref 20–32)
Calcium: 10.1 mg/dL (ref 8.6–10.3)
Chloride: 98 mmol/L (ref 98–110)
Creat: 1.25 mg/dL (ref 0.60–1.35)
Glucose, Bld: 93 mg/dL (ref 65–99)
Potassium: 3.6 mmol/L (ref 3.5–5.3)
Sodium: 137 mmol/L (ref 135–146)

## 2018-10-11 LAB — LIPID PANEL
Cholesterol: 219 mg/dL — ABNORMAL HIGH (ref <200)
HDL: 38 mg/dL — ABNORMAL LOW (ref >=40)
LDL Cholesterol (Calc): 159 mg/dL (calc) — ABNORMAL HIGH
Non-HDL Cholesterol (Calc): 181 mg/dL (calc) — ABNORMAL HIGH (ref <130)
Total CHOL/HDL Ratio: 5.8 (calc) — ABNORMAL HIGH (ref <5.0)
Triglycerides: 108 mg/dL (ref <150)

## 2018-10-11 LAB — CBC WITH DIFFERENTIAL/PLATELET
Absolute Monocytes: 386 cells/uL (ref 200–950)
Basophils Absolute: 51 cells/uL (ref 0–200)
Basophils Relative: 1.3 %
Eosinophils Absolute: 82 cells/uL (ref 15–500)
Eosinophils Relative: 2.1 %
HCT: 50.5 % — ABNORMAL HIGH (ref 38.5–50.0)
Hemoglobin: 17.3 g/dL — ABNORMAL HIGH (ref 13.2–17.1)
Lymphs Abs: 2067 cells/uL (ref 850–3900)
MCH: 31.3 pg (ref 27.0–33.0)
MCHC: 34.3 g/dL (ref 32.0–36.0)
MCV: 91.5 fL (ref 80.0–100.0)
MPV: 9.7 fL (ref 7.5–12.5)
Monocytes Relative: 9.9 %
Neutro Abs: 1314 cells/uL — ABNORMAL LOW (ref 1500–7800)
Neutrophils Relative %: 33.7 %
Platelets: 308 10*3/uL (ref 140–400)
RBC: 5.52 10*6/uL (ref 4.20–5.80)
RDW: 12.9 % (ref 11.0–15.0)
Total Lymphocyte: 53 %
WBC: 3.9 10*3/uL (ref 3.8–10.8)

## 2018-10-11 LAB — URINE CYTOLOGY ANCILLARY ONLY
Chlamydia: NEGATIVE
Neisseria Gonorrhea: NEGATIVE
Trichomonas: NEGATIVE

## 2018-10-11 LAB — CERVICOVAGINAL ANCILLARY ONLY
Chlamydia: NEGATIVE
Neisseria Gonorrhea: NEGATIVE

## 2018-10-11 LAB — RPR: RPR Ser Ql: NONREACTIVE

## 2018-10-11 LAB — HIV ANTIBODY (ROUTINE TESTING W REFLEX): HIV 1&2 Ab, 4th Generation: NONREACTIVE

## 2018-10-17 ENCOUNTER — Other Ambulatory Visit: Payer: Self-pay | Admitting: Nurse Practitioner

## 2018-10-17 DIAGNOSIS — D709 Neutropenia, unspecified: Secondary | ICD-10-CM

## 2018-10-18 LAB — CBC WITH DIFFERENTIAL/PLATELET
Absolute Monocytes: 475 cells/uL (ref 200–950)
Basophils Absolute: 49 cells/uL (ref 0–200)
Basophils Relative: 1 %
Eosinophils Absolute: 172 cells/uL (ref 15–500)
Eosinophils Relative: 3.5 %
HCT: 41.2 % (ref 38.5–50.0)
Hemoglobin: 14.9 g/dL (ref 13.2–17.1)
Lymphs Abs: 2862 cells/uL (ref 850–3900)
MCH: 32.6 pg (ref 27.0–33.0)
MCHC: 36.2 g/dL — ABNORMAL HIGH (ref 32.0–36.0)
MCV: 90.2 fL (ref 80.0–100.0)
MPV: 9.6 fL (ref 7.5–12.5)
Monocytes Relative: 9.7 %
Neutro Abs: 1343 cells/uL — ABNORMAL LOW (ref 1500–7800)
Neutrophils Relative %: 27.4 %
Platelets: 268 10*3/uL (ref 140–400)
RBC: 4.57 10*6/uL (ref 4.20–5.80)
RDW: 12.9 % (ref 11.0–15.0)
Total Lymphocyte: 58.4 %
WBC: 4.9 10*3/uL (ref 3.8–10.8)

## 2019-01-10 ENCOUNTER — Ambulatory Visit: Payer: Self-pay | Admitting: Nurse Practitioner

## 2019-04-11 ENCOUNTER — Other Ambulatory Visit: Payer: Self-pay | Admitting: Family Medicine

## 2019-04-11 NOTE — Telephone Encounter (Signed)
Requested medication (s) are due for refill today: no  Requested medication (s) are on the active medication list: yes  Last refill:  10/31/2018  Future visit scheduled: no  Notes to clinic:  No assigned protocol    Requested Prescriptions  Pending Prescriptions Disp Refills   amoxicillin (AMOXIL) 500 MG capsule [Pharmacy Med Name: Amoxicillin 500 MG Oral Capsule] 4 capsule 0    Sig: TAKE 4 CAPSULES BY MOUTH ONCE 30 TO 60 MINUTES PRIOR TO APPOINTMENT     Off-Protocol Failed - 04/11/2019  3:12 PM      Failed - Medication not assigned to a protocol, review manually.      Passed - Valid encounter within last 12 months    Recent Outpatient Visits          6 months ago Essential hypertension, benign   [place], NP   1 year ago Essential hypertension, benign   [place], Satira Anis, MD   1 year ago Essential hypertension, benign   [place], Satira Anis, MD   1 year ago Status post cardiac surgery   [facility] [place], Satira Anis, MD   1 year ago Accelerated hypertension   [facility] [place], Satira Anis, MD

## 2019-07-17 ENCOUNTER — Other Ambulatory Visit: Payer: Self-pay | Admitting: Family Medicine

## 2019-07-17 DIAGNOSIS — Z9889 Other specified postprocedural states: Secondary | ICD-10-CM

## 2019-07-17 DIAGNOSIS — Z0000 Encounter for general adult medical examination without abnormal findings: Secondary | ICD-10-CM

## 2019-07-17 NOTE — Telephone Encounter (Signed)
Pt states he has to schedule an emergency appt and is requesting to have this medication sent in today. Please advise

## 2019-07-17 NOTE — Telephone Encounter (Signed)
Spoke to patient and he stated that he is having a dental procedure on tomorrow

## 2019-08-03 ENCOUNTER — Other Ambulatory Visit: Payer: Self-pay | Admitting: Family Medicine

## 2019-08-03 NOTE — Telephone Encounter (Signed)
Requested medication (s) are due for refill today -yes  Requested medication (s) are on the active medication list -yes  Future visit scheduled -yes  Last refill: 12/2/201  Notes to clinic: Patient may have courtesy refill- needs to be from active provider in practice   Requested Prescriptions  Pending Prescriptions Disp Refills   hydrochlorothiazide (HYDRODIURIL) 25 MG tablet [Pharmacy Med Name: hydroCHLOROthiazide 25 MG Oral Tablet] 90 tablet 0    Sig: Take 1 tablet by mouth once daily      Cardiovascular: Diuretics - Thiazide Failed - 08/03/2019  2:56 PM      Failed - Valid encounter within last 6 months    Recent Outpatient Visits           9 months ago Essential hypertension, benign   [facility] [place], Bethel Born, NP   1 year ago Essential hypertension, benign   [facility] [place], Satira Anis, MD   1 year ago Essential hypertension, benign   [facility] [place], Satira Anis, MD   2 years ago Status post cardiac surgery   [employer], Satira Anis, MD   2 years ago Accelerated hypertension   [facility] [place], Satira Anis, MD              Passed - Ca in normal range and within 360 days    Calcium  Date Value Ref Range Status  10/10/2018 10.1 8.6 - 10.3 mg/dL Final   Calcium, Total  Date Value Ref Range Status  11/30/2013 9.1 8.5 - 10.1 mg/dL Final          Passed - Cr in normal range and within 360 days    Creat  Date Value Ref Range Status  10/10/2018 1.25 0.60 - 1.35 mg/dL Final   Creatinine, Urine  Date Value Ref Range Status  06/08/2017 196 20 - 320 mg/dL Final          Passed - K in normal range and within 360 days    Potassium  Date Value Ref Range Status  10/10/2018 3.6 3.5 - 5.3 mmol/L Final  11/30/2013 3.8 3.5 - 5.1 mmol/L Final          Passed - Na in normal range and within 360 days    Sodium  Date Value Ref Range Status  10/10/2018 137 135  - 146 mmol/L Final  11/30/2013 137 136 - 145 mmol/L Final          Passed - Last BP in normal range    BP Readings from Last 1 Encounters:  10/10/18 116/70              Requested Prescriptions  Pending Prescriptions Disp Refills   hydrochlorothiazide (HYDRODIURIL) 25 MG tablet [Pharmacy Med Name: hydroCHLOROthiazide 25 MG Oral Tablet] 90 tablet 0    Sig: Take 1 tablet by mouth once daily      Cardiovascular: Diuretics - Thiazide Failed - 08/03/2019  2:56 PM      Failed - Valid encounter within last 6 months    Recent Outpatient Visits           9 months ago Essential hypertension, benign   [place], NP   1 year ago Essential hypertension, benign   [place], Satira Anis, MD   1 year ago Essential hypertension, benign   [place], Satira Anis, MD   2 years ago Status  post cardiac surgery   [place], Satira Anis, MD   2 years ago Accelerated hypertension   [facility] [place], Satira Anis, MD              Passed - Ca in normal range and within 360 days    Calcium  Date Value Ref Range Status  10/10/2018 10.1 8.6 - 10.3 mg/dL Final   Calcium, Total  Date Value Ref Range Status  11/30/2013 9.1 8.5 - 10.1 mg/dL Final          Passed - Cr in normal range and within 360 days    Creat  Date Value Ref Range Status  10/10/2018 1.25 0.60 - 1.35 mg/dL Final   Creatinine, Urine  Date Value Ref Range Status  06/08/2017 196 20 - 320 mg/dL Final          Passed - K in normal range and within 360 days    Potassium  Date Value Ref Range Status  10/10/2018 3.6 3.5 - 5.3 mmol/L Final  11/30/2013 3.8 3.5 - 5.1 mmol/L Final          Passed - Na in normal range and within 360 days    Sodium  Date Value Ref Range Status  10/10/2018 137 135 - 146 mmol/L Final  11/30/2013 137 136 - 145 mmol/L Final          Passed - Last BP in  normal range    BP Readings from Last 1 Encounters:  10/10/18 116/70

## 2019-08-06 NOTE — Telephone Encounter (Signed)
Hypertension medication request:  Last office visit pertaining to hypertension:10/10/18  BP Readings from Last 3 Encounters:  10/10/18 116/70  04/10/18 128/88  10/04/17 132/86    Lab Results  Component Value Date   CREATININE 1.25 10/10/2018   BUN 8 10/10/2018   NA 137 10/10/2018   K 3.6 10/10/2018   CL 98 10/10/2018   CO2 26 10/10/2018     No follow-ups on file.

## 2019-08-16 ENCOUNTER — Other Ambulatory Visit: Payer: Self-pay

## 2019-08-16 ENCOUNTER — Encounter: Payer: Self-pay | Admitting: Family Medicine

## 2019-08-16 ENCOUNTER — Ambulatory Visit (INDEPENDENT_AMBULATORY_CARE_PROVIDER_SITE_OTHER): Payer: Self-pay | Admitting: Family Medicine

## 2019-08-16 VITALS — BP 122/88 | HR 89 | Temp 98.3°F | Resp 98 | Ht 65.0 in | Wt 144.2 lb

## 2019-08-16 DIAGNOSIS — D709 Neutropenia, unspecified: Secondary | ICD-10-CM

## 2019-08-16 DIAGNOSIS — J4520 Mild intermittent asthma, uncomplicated: Secondary | ICD-10-CM

## 2019-08-16 DIAGNOSIS — I10 Essential (primary) hypertension: Secondary | ICD-10-CM

## 2019-08-16 DIAGNOSIS — Z9889 Other specified postprocedural states: Secondary | ICD-10-CM

## 2019-08-16 LAB — COMPLETE METABOLIC PANEL WITH GFR
AG Ratio: 1.8 (calc) (ref 1.0–2.5)
ALT: 11 U/L (ref 9–46)
AST: 13 U/L (ref 10–40)
Albumin: 4.6 g/dL (ref 3.6–5.1)
Alkaline phosphatase (APISO): 43 U/L (ref 36–130)
BUN: 9 mg/dL (ref 7–25)
CO2: 32 mmol/L (ref 20–32)
Calcium: 9.9 mg/dL (ref 8.6–10.3)
Chloride: 98 mmol/L (ref 98–110)
Creat: 1.06 mg/dL (ref 0.60–1.35)
GFR, Est African American: 108 mL/min/{1.73_m2} (ref >=60)
GFR, Est Non African American: 93 mL/min/{1.73_m2} (ref >=60)
Globulin: 2.6 g/dL (calc) (ref 1.9–3.7)
Glucose, Bld: 90 mg/dL (ref 65–99)
Potassium: 3.8 mmol/L (ref 3.5–5.3)
Sodium: 138 mmol/L (ref 135–146)
Total Bilirubin: 0.4 mg/dL (ref 0.2–1.2)
Total Protein: 7.2 g/dL (ref 6.1–8.1)

## 2019-08-16 MED ORDER — METOPROLOL SUCCINATE ER 25 MG PO TB24: 25.0000 mg | ORAL_TABLET | Freq: Every day | ORAL | 3 refills | Status: DC

## 2019-08-16 MED ORDER — HYDROCHLOROTHIAZIDE 25 MG PO TABS: 25.0000 mg | ORAL_TABLET | Freq: Every day | ORAL | 3 refills | Status: DC

## 2019-08-16 MED ORDER — MONTELUKAST SODIUM 10 MG PO TABS: 10.0000 mg | ORAL_TABLET | Freq: Every day | ORAL | 5 refills | Status: AC

## 2019-08-16 NOTE — Progress Notes (Signed)
Name: Fernando Reid   MRN: [id number]    DOB: 01/09/1986   Date:08/16/2019       Progress Note  Chief Complaint  Patient presents with  . Follow-up  . Hypertension     Subjective:   Fernando Reid is a 34 y.o. male, presents to clinic for routine follow up on the conditions listed above.  Hypertension:  HTN for several years, past dx of accelerated HTN, did renal doppler was referred to nephrologist and cardiologist in the past, he did previously see cardiology and had a echo and EKG which were good, cardiology did not need to follow him unless he develops symptoms.  Patient states he did not follow up with nephrology.  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 Hx of cardiac surgery to fix " hole in his heart" Born here in [place], doesn't know where surgery was done.  Previously went to [facility] Patient recalls that it was possibly a repair for PDA?   Per cardiology she has a murmur and reviewed his past echo there is only trace mitral regurg and mild tricuspid regurg no other valvular issues and normal LVEF Currently managed on HCTZ  Metoprolol Pt reports good med compliance and denies any SE.  No lightheadedness, hypotension, syncope. Blood pressure today is well controlled. BP Readings from Last 3 Encounters:  08/16/19 122/88  10/10/18 116/70  04/10/18 128/88  Pt denies CP, SOB, exertional sx, LE edema, palpitation, Ha's, visual disturbances Patient is willing to do basic labs today to check his kidney function and electrolytes he does have a history of hyperlipidemia and neutropenia but he will defer checking on those today and he was originally scheduled for his physical but he would like to push that back as well until he has insurance coverage.  Asthma -patient has history of asthma that is well  controlled usually with rescue inhaler he tends to be bothered by his seasonal allergies which are worse in spring and fall.  He has tried maintenance inhalers in the past but he did not like them or feel they were effective at all for him.  He just started to use his inhaler a lot more frequently in the past 1 to 2 weeks.  He is not on any allergy medications.   08/16/19   0920  Asthma History   Symptoms Daily  Nighttime Awakenings 0-2/month  Asthma interference with normal activity No limitations  SABA use (not for EIB) Several times/day  Risk: Exacerbations requiring oral systemic steroids 0-1 / year  Asthma Severity IntermittentAsthma Severity. Intermittent. The comment is seasonal - spring allergy triggers. Taken on 08/16/19 0920  Since weather change in the past 1 to 2 weeks he is using the inhaler multiple times a day he is having only a few episodes of nighttime waking, he does not feel like his asthma is interfering with any physical activity and he denies any history of requiring oral steroids for exacerbations.  Patient recently asked for antibiotics which he uses prior to dental procedures.  He states that it was a an emergent procedure and he usually takes a 2000 mg dose of amoxicillin about 30 minutes to 60 minutes prior to dental appointments or procedures.  This was prescribed for him about 1 month ago.  He states everything went  well.    Patient Active Problem List   Diagnosis Date Noted  . Irritable bowel syndrome with both constipation and diarrhea 10/10/2018  . Low HDL (under 40) 04/10/2018  . Renal disease 08/15/2017  . Kidney stones 08/15/2017  . Family history of congenital anomaly of cardiovascular system 07/07/2017  . Gynecomastia 06/13/2017  . Accelerated hypertension 06/08/2017  . Neutropenia ([place]) 06/08/2017  . Preventative health care 06/06/2017  . Splinter hemorrhage of fingernail 06/06/2017  . Essential hypertension, benign 06/06/2017  . Status post cardiac  surgery 02/25/2017    Past Surgical History:  Procedure Laterality Date  . CARDIAC SURGERY     to fix hole in heart    Family History  Problem Relation Age of Onset  . Diabetes Mother   . Diabetes Father   . COPD Maternal Grandmother   . Bronchitis Maternal Grandmother   . Hypertension Maternal Grandfather   . CAD Maternal Grandfather   . Aneurysm Paternal Grandfather     Social History   Tobacco Use  . Smoking status: Never Smoker  . Smokeless tobacco: Never Used  Substance Use Topics  . Alcohol use: No  . Drug use: No      Current Outpatient Medications:  .  albuterol (PROVENTIL HFA;VENTOLIN HFA) 108 (90 Base) MCG/ACT inhaler, Inhale 2 puffs into the lungs every 4 (four) hours as needed for wheezing or shortness of breath., Disp: 1 Inhaler, Rfl: 1 .  amoxicillin (AMOXIL) 500 MG capsule, TAKE 4 CAPSULES BY MOUTH ONCE 30 TO 60 MINUTES PRIOR TO APPOINTMENT, Disp: 4 capsule, Rfl: 0 .  hydrochlorothiazide (HYDRODIURIL) 25 MG tablet, Take 1 tablet by mouth once daily, Disp: 90 tablet, Rfl: 0 .  metoprolol succinate (TOPROL-XL) 25 MG 24 hr tablet, Take 1 tablet (25 mg total) by mouth daily., Disp: 90 tablet, Rfl: 3  No Known Allergies  Chart Review Today: I personally reviewed active problem list, medication list, allergies, family history, social history, health maintenance, notes from last encounter, lab results, imaging with the patient/caregiver today.   Review of Systems  10 Systems reviewed and are negative for acute change except as noted in the HPI.    Objective:    Vitals:   08/16/19 0829  BP: 122/88  Pulse: 89  Resp: (!) 98  Temp: 98.3 F (36.8 C)  Weight: 144 lb 3.2 oz (65.4 kg)  Height: 5\' 5"  (1.651 m)  HC: 14" (35.6 cm)    Body mass index is 24 kg/m.  Physical Exam Vitals and nursing note reviewed.  Constitutional:      General: He is not in acute distress.    Appearance: Normal appearance. He is well-developed and normal weight. He is not  ill-appearing, toxic-appearing or diaphoretic.     Interventions: Face mask in place.  HENT:     Head: Normocephalic and atraumatic.     Jaw: No trismus.     Right Ear: External ear normal.     Left Ear: External ear normal.  Eyes:     General: Lids are normal. No scleral icterus.    Conjunctiva/sclera: Conjunctivae normal.     Pupils: Pupils are equal, round, and reactive to light.  Neck:     Trachea: Trachea and phonation normal. No tracheal deviation.  Cardiovascular:     Rate and Rhythm: Normal rate and regular rhythm.     Pulses: Normal pulses.          Radial pulses are 2+ on the right side and 2+ on the left side.  Posterior tibial pulses are 2+ on the right side and 2+ on the left side.     Heart sounds: Normal heart sounds. No murmur. No friction rub. No gallop.   Pulmonary:     Effort: Pulmonary effort is normal. No respiratory distress.     Breath sounds: Normal breath sounds. No stridor. No wheezing, rhonchi or rales.  Abdominal:     General: Bowel sounds are normal. There is no distension.     Palpations: Abdomen is soft.     Tenderness: There is no abdominal tenderness. There is no guarding or rebound.  Musculoskeletal:        General: Normal range of motion.     Cervical back: Normal range of motion and neck supple.     Right lower leg: No edema.     Left lower leg: No edema.  Skin:    General: Skin is warm and dry.     Capillary Refill: Capillary refill takes less than 2 seconds.     Coloration: Skin is not jaundiced.     Findings: No rash.     Nails: There is no clubbing.  Neurological:     Mental Status: He is alert.     Cranial Nerves: No dysarthria or facial asymmetry.     Motor: No tremor or abnormal muscle tone.     Gait: Gait normal.  Psychiatric:        Mood and Affect: Mood normal.        Speech: Speech normal.        Behavior: Behavior normal. Behavior is cooperative.       PHQ2/9: Depression screen [facility] 2/9 08/16/2019 10/10/2018 10/04/2017  07/07/2017 06/06/2017  Decreased Interest 0 0 0 0 0  Down, Depressed, Hopeless 0 0 0 0 0  PHQ - 2 Score 0 0 0 0 0  Altered sleeping 0 0 - - -  Tired, decreased energy 0 0 - - -  Change in appetite 0 0 - - -  Feeling bad or failure about yourself  0 0 - - -  Trouble concentrating 0 0 - - -  Moving slowly or fidgety/restless 0 0 - - -  Suicidal thoughts 0 0 - - -  PHQ-9 Score 0 0 - - -  Difficult doing work/chores Not difficult at all Not difficult at all - - -    phq 9 is neg, reviewed  Fall Risk: Fall Risk  08/16/2019 10/10/2018 04/10/2018 10/04/2017 07/07/2017  Falls in the past year? 0 0 0 No No  Number falls in past yr: 0 - 0 - -  Injury with Fall? 0 - - - -    Functional Status Survey: Is the patient deaf or have difficulty hearing?: No Does the patient have difficulty seeing, even when wearing glasses/contacts?: No Does the patient have difficulty concentrating, remembering, or making decisions?: No Does the patient have difficulty walking or climbing stairs?: No Does the patient have difficulty dressing or bathing?: No Does the patient have difficulty doing errands alone such as visiting a doctor's office or shopping?: No   Assessment & Plan:    1. Essential hypertension, benign Blood pressure well controlled on his medications he has no exertional symptoms or side effects from metoprolol, he denies any concerns with hydrochlorothiazide, he does have history of kidney stones he has not had any in some time, he denies any problems with urinary frequency, dehydration, muscle cramps etc.  He is willing to check and pay for his CMP today,  we will defer checking on his cholesterol and neutropenia due to cost and lack of insurance coverage.  Encourage patient to reach out to his current coverage through [place] and inquire about any coverage at places like [facility] - metoprolol succinate (TOPROL-XL) 25 MG 24 hr tablet; Take 1 tablet (25 mg total) by mouth daily.  Dispense: 90 tablet; Refill:  3 - hydrochlorothiazide (HYDRODIURIL) 25 MG tablet; Take 1 tablet (25 mg total) by mouth daily.  Dispense: 90 tablet; Refill: 3 - COMPLETE METABOLIC PANEL WITH GFR  2. Status post cardiac surgery Patient requires prophylactic antibiotics due to his history of cardiac surgery although the surgical history is unclear.  Recent cardiac evaluation was unremarkable and they do not require him to do scheduled monitoring.  They did encourage him to follow-up if he is symptomatic, he currently has no cardiac symptoms or exertional symptoms.  3. Neutropenia, unspecified type ([place]) Defers labs due to cost  4. Mild intermittent asthma, unspecified whether complicated Patient has a albuterol inhaler on his chart he states that he just filled it due to the worsening breathing chest tightness wheeze that is triggered by seasonal allergies usually in the spring and in the fall.  It does sound like he has asthma or reactive airway although this diagnosis is not on his chart.  Patient does seem to have currently uncontrolled asthma or reactive airway.  He does not want to do a daily maintenance inhalers have encouraged him to get over-the-counter allergy medications like Zyrtec Claritin Allegra and Flonase or Nasonex and start those daily and also start Singulair daily to see if we can control his allergy triggers.  Explained stepwise therapy to him for asthma control.  Encouraged him to follow-up if he is using his inhaler more than 2 times a day if he is waking up at night several times a week or if he is finding he needs to refill his albuterol inhaler more than once every 2 months - montelukast (SINGULAIR) 10 MG tablet; Take 1 tablet (10 mg total) by mouth at bedtime.  Dispense: 30 tablet; Refill: 5   Return for CPE in 6 months (if insurance) 12 month follow up for HTN/HLD/asthma .   Delsa Grana, PA-C 08/16/19 9:03 AM

## 2019-11-20 ENCOUNTER — Other Ambulatory Visit: Payer: Self-pay | Admitting: Family Medicine

## 2019-11-20 DIAGNOSIS — I10 Essential (primary) hypertension: Secondary | ICD-10-CM

## 2019-11-26 ENCOUNTER — Other Ambulatory Visit: Payer: Self-pay | Admitting: Family Medicine

## 2019-11-26 DIAGNOSIS — I10 Essential (primary) hypertension: Secondary | ICD-10-CM

## 2019-12-21 ENCOUNTER — Other Ambulatory Visit: Payer: Self-pay | Admitting: Family Medicine

## 2019-12-21 DIAGNOSIS — Z9889 Other specified postprocedural states: Secondary | ICD-10-CM

## 2019-12-26 ENCOUNTER — Other Ambulatory Visit: Payer: Self-pay | Admitting: Family Medicine

## 2019-12-26 DIAGNOSIS — Z9889 Other specified postprocedural states: Secondary | ICD-10-CM

## 2019-12-29 ENCOUNTER — Other Ambulatory Visit: Payer: Self-pay | Admitting: Family Medicine

## 2019-12-29 DIAGNOSIS — Z9889 Other specified postprocedural states: Secondary | ICD-10-CM

## 2019-12-31 ENCOUNTER — Telehealth: Payer: Self-pay | Admitting: Family Medicine

## 2019-12-31 ENCOUNTER — Other Ambulatory Visit: Payer: Self-pay | Admitting: Family Medicine

## 2019-12-31 DIAGNOSIS — Z9889 Other specified postprocedural states: Secondary | ICD-10-CM

## 2019-12-31 NOTE — Telephone Encounter (Signed)
Copied from [place] [id number]. Topic: Quick Communication - Rx Refill/Question >> Dec 31, 2019  1:06 PM Leward Quan A wrote: Medication: amoxicillin (AMOXIL) 500 MG capsule  Need for dental appointment on 01/01/20 at 12 PM  Has the patient contacted their pharmacy? Yes.   (Agent: If no, request that the patient contact the pharmacy for the refill.) (Agent: If yes, when and what did the pharmacy advise?)  Preferred Pharmacy (with phone number or street name): [facility] [place] [facility] [address]  Phone:  [phone] Fax:  [phone]     Agent: Please be advised that RX refills may take up to 3 business days. We ask that you follow-up with your pharmacy.

## 2020-01-01 NOTE — Telephone Encounter (Signed)
Do you want to give him a few tablets

## 2020-01-01 NOTE — Telephone Encounter (Signed)
Patient will pick up script today

## 2020-07-03 ENCOUNTER — Other Ambulatory Visit: Payer: Self-pay | Admitting: Family Medicine

## 2020-07-03 DIAGNOSIS — Z9889 Other specified postprocedural states: Secondary | ICD-10-CM

## 2020-07-08 ENCOUNTER — Other Ambulatory Visit: Payer: Self-pay | Admitting: Family Medicine

## 2020-07-08 DIAGNOSIS — Z9889 Other specified postprocedural states: Secondary | ICD-10-CM

## 2020-07-08 MED ORDER — AMOXICILLIN 500 MG PO CAPS: ORAL_CAPSULE | ORAL | 0 refills | Status: AC

## 2020-07-08 NOTE — Telephone Encounter (Signed)
Pt had a dental appt today and has to go back tomorrow.  Pt needs amoxicillin prior to the appt. Pt asking for a few tabs.

## 2020-07-08 NOTE — Telephone Encounter (Signed)
Please advise 

## 2020-08-15 ENCOUNTER — Ambulatory Visit: Payer: Self-pay | Admitting: Family Medicine

## 2020-11-21 ENCOUNTER — Other Ambulatory Visit: Payer: Self-pay | Admitting: Family Medicine

## 2020-11-21 DIAGNOSIS — I10 Essential (primary) hypertension: Secondary | ICD-10-CM

## 2020-11-21 MED ORDER — HYDROCHLOROTHIAZIDE 25 MG PO TABS: 25.0000 mg | ORAL_TABLET | Freq: Every day | ORAL | 3 refills | Status: AC

## 2020-11-21 MED ORDER — METOPROLOL SUCCINATE ER 25 MG PO TB24: 25.0000 mg | ORAL_TABLET | Freq: Every day | ORAL | 3 refills | Status: AC

## 2020-11-21 NOTE — Telephone Encounter (Signed)
Notes to clinic:  moved to [facility] and still has not been able to find a new PCP/ Pt would like to know if he can get a refill    Requested Prescriptions  Pending Prescriptions Disp Refills   metoprolol succinate (TOPROL-XL) 25 MG 24 hr tablet 90 tablet 3    Sig: Take 1 tablet (25 mg total) by mouth daily.      Cardiovascular:  Beta Blockers Failed - 11/21/2020 10:25 AM      Failed - Valid encounter within last 6 months    Recent Outpatient Visits           1 year ago Essential hypertension, benign   [facility] Delsa Grana, PA-C   2 years ago Essential hypertension, benign   [place], Bethel Born, NP   2 years ago Essential hypertension, benign   [facility] [place], Satira Anis, MD   3 years ago Essential hypertension, benign   [facility] [place], Satira Anis, MD   3 years ago Status post cardiac surgery   [place], Satira Anis, MD                Passed - Last BP in normal range    BP Readings from Last 1 Encounters:  08/16/19 122/88          Passed - Last Heart Rate in normal range    Pulse Readings from Last 1 Encounters:  08/16/19 89            hydrochlorothiazide (HYDRODIURIL) 25 MG tablet 90 tablet 3    Sig: Take 1 tablet (25 mg total) by mouth daily.      Cardiovascular: Diuretics - Thiazide Failed - 11/21/2020 10:25 AM      Failed - Ca in normal range and within 360 days    Calcium  Date Value Ref Range Status  08/16/2019 9.9 8.6 - 10.3 mg/dL Final   Calcium, Total  Date Value Ref Range Status  11/30/2013 9.1 8.5 - 10.1 mg/dL Final          Failed - Cr in normal range and within 360 days    Creat  Date Value Ref Range Status  08/16/2019 1.06 0.60 - 1.35 mg/dL Final   Creatinine, Urine  Date Value Ref Range Status  06/08/2017 196 20 - 320 mg/dL Final          Failed - K in normal range and within 360 days    Potassium  Date Value  Ref Range Status  08/16/2019 3.8 3.5 - 5.3 mmol/L Final  11/30/2013 3.8 3.5 - 5.1 mmol/L Final          Failed - Na in normal range and within 360 days    Sodium  Date Value Ref Range Status  08/16/2019 138 135 - 146 mmol/L Final  11/30/2013 137 136 - 145 mmol/L Final          Failed - Valid encounter within last 6 months    Recent Outpatient Visits           1 year ago Essential hypertension, benign   [facility] Delsa Grana, PA-C   2 years ago Essential hypertension, benign   [place], NP   2 years ago Essential hypertension, benign   [place], Satira Anis, MD   3 years ago Essential hypertension, benign   [facility] [place], Satira Anis, MD   3 years ago Status post cardiac surgery   [place], Satira Anis, MD                Passed - Last BP in normal range    BP Readings from Last 1 Encounters:  08/16/19 122/88

## 2020-11-21 NOTE — Telephone Encounter (Signed)
Last seen 4.8.2022 no future appts sch'd

## 2020-11-21 NOTE — Telephone Encounter (Signed)
Pt has moved to [facility] and still has not been able to find a new PCP/ Pt would like to know if he can get a refill for hydrochlorothiazide (HYDRODIURIL) 25 MG tablet And  metoprolol succinate (TOPROL-XL) 25 MG 24 hr tablet  Sent to  [id number] - [place], [place] Phone:  [phone]  Fax:  [phone]

## 2021-11-20 ENCOUNTER — Telehealth: Payer: Self-pay | Admitting: Family Medicine

## 2021-11-20 DIAGNOSIS — I10 Essential (primary) hypertension: Secondary | ICD-10-CM

## 2021-11-20 NOTE — Telephone Encounter (Signed)
Lvm to inform pt that he needs to call the office and schedule an appt

## 2021-11-20 NOTE — Telephone Encounter (Signed)
Requested medication (s) are due for refill today: yes  Requested medication (s) are on the active medication list:yes  Last refill:  11/21/20  Future visit scheduled: no  Notes to clinic:  Unable to refill per protocol, appointment needed.  Called pt, left VM to call back and schedule CPE.     Requested Prescriptions  Pending Prescriptions Disp Refills   hydrochlorothiazide (HYDRODIURIL) 25 MG tablet [Pharmacy Med Name: HYDROCHLOROTHIAZIDE 25 MG TAB] 90 tablet 3    Sig: Take 1 tablet (25 mg total) by mouth daily.     Cardiovascular: Diuretics - Thiazide Failed - 11/20/2021 12:08 AM      Failed - Cr in normal range and within 180 days    Creat  Date Value Ref Range Status  08/16/2019 1.06 0.60 - 1.35 mg/dL Final   Creatinine, Urine  Date Value Ref Range Status  06/08/2017 196 20 - 320 mg/dL Final         Failed - K in normal range and within 180 days    Potassium  Date Value Ref Range Status  08/16/2019 3.8 3.5 - 5.3 mmol/L Final  11/30/2013 3.8 3.5 - 5.1 mmol/L Final         Failed - Na in normal range and within 180 days    Sodium  Date Value Ref Range Status  08/16/2019 138 135 - 146 mmol/L Final  11/30/2013 137 136 - 145 mmol/L Final         Failed - Valid encounter within last 6 months    Recent Outpatient Visits           2 years ago Essential hypertension, benign   [facility] Delsa Grana, PA-C   3 years ago Essential hypertension, benign   [facility] Fred[facility]ick [place], NP   3 years ago Essential hypertension, benign   [place], Satira Anis, MD   4 years ago Essential hypertension, benign   [facility] [place], Satira Anis, MD   4 years ago Status post cardiac surgery   [facility] [place], Satira Anis, MD              Passed - Last BP in normal range    BP Readings from Last 1 Encounters:  08/16/19 122/88          metoprolol succinate  (TOPROL-XL) 25 MG 24 hr tablet [Pharmacy Med Name: METOPROLOL SUCC ER 25 MG TAB] 90 tablet 3    Sig: Take 1 tablet (25 mg total) by mouth daily.     Cardiovascular:  Beta Blockers Failed - 11/20/2021 12:08 AM      Failed - Valid encounter within last 6 months    Recent Outpatient Visits           2 years ago Essential hypertension, benign   [facility] Delsa Grana, PA-C   3 years ago Essential hypertension, benign   [facility] [place], Bethel Born, NP   3 years ago Essential hypertension, benign   [place], Satira Anis, MD   4 years ago Essential hypertension, benign   [facility] [place], Satira Anis, MD   4 years ago Status post cardiac surgery   [facility] [place], Satira Anis, MD              Passed - Last BP in normal range    BP Readings from Last 1 Encounters:  08/16/19 122/88         Passed - Last Heart Rate in normal range    Pulse Readings from Last 1 Encounters:  08/16/19 89

## 2022-06-25 ENCOUNTER — Other Ambulatory Visit: Payer: Self-pay | Admitting: Family Medicine

## 2022-06-25 DIAGNOSIS — I10 Essential (primary) hypertension: Secondary | ICD-10-CM
